# Patient Record
Sex: Female | Born: 1960 | Race: Black or African American | Hispanic: No | Marital: Married | Smoking: Never smoker
Health system: Southern US, Community
[De-identification: ages and names within clinical notes are randomized; demographics above are authoritative.]

## PROBLEM LIST (undated history)

## (undated) DIAGNOSIS — I10 Essential (primary) hypertension: Secondary | ICD-10-CM

## (undated) HISTORY — PX: MYOMECTOMY: SHX85

## (undated) HISTORY — DX: Essential (primary) hypertension: I10

## (undated) HISTORY — PX: ABDOMINAL HYSTERECTOMY: SHX81

---

## 2003-09-14 ENCOUNTER — Encounter: Admission: RE | Admit: 2003-09-14 | Discharge: 2003-09-14 | Payer: Self-pay | Admitting: Family Medicine

## 2003-09-29 ENCOUNTER — Encounter: Admission: RE | Admit: 2003-09-29 | Discharge: 2003-09-29 | Payer: Self-pay | Admitting: Family Medicine

## 2004-11-11 ENCOUNTER — Encounter: Admission: RE | Admit: 2004-11-11 | Discharge: 2004-11-11 | Payer: Self-pay | Admitting: Family Medicine

## 2004-12-06 ENCOUNTER — Encounter: Admission: RE | Admit: 2004-12-06 | Discharge: 2004-12-06 | Payer: Self-pay | Admitting: Family Medicine

## 2004-12-15 ENCOUNTER — Ambulatory Visit (HOSPITAL_COMMUNITY): Admission: RE | Admit: 2004-12-15 | Discharge: 2004-12-15 | Payer: Self-pay | Admitting: Gastroenterology

## 2005-02-13 LAB — HM COLONOSCOPY: HM Colonoscopy: NORMAL

## 2006-03-21 ENCOUNTER — Encounter: Admission: RE | Admit: 2006-03-21 | Discharge: 2006-03-21 | Payer: Self-pay | Admitting: Family Medicine

## 2007-04-25 ENCOUNTER — Ambulatory Visit (HOSPITAL_COMMUNITY): Admission: RE | Admit: 2007-04-25 | Discharge: 2007-04-25 | Payer: Self-pay | Admitting: Family Medicine

## 2008-03-27 LAB — HM PAP SMEAR: HM Pap smear: NORMAL

## 2008-06-09 ENCOUNTER — Encounter: Admission: RE | Admit: 2008-06-09 | Discharge: 2008-06-09 | Payer: Self-pay | Admitting: Family Medicine

## 2009-06-16 ENCOUNTER — Ambulatory Visit (HOSPITAL_COMMUNITY): Admission: RE | Admit: 2009-06-16 | Discharge: 2009-06-16 | Payer: Self-pay | Admitting: Family Medicine

## 2010-07-10 ENCOUNTER — Encounter: Payer: Self-pay | Admitting: Family Medicine

## 2010-07-15 ENCOUNTER — Other Ambulatory Visit: Payer: Self-pay | Admitting: Family Medicine

## 2010-07-15 DIAGNOSIS — Z139 Encounter for screening, unspecified: Secondary | ICD-10-CM

## 2010-07-20 ENCOUNTER — Ambulatory Visit (HOSPITAL_COMMUNITY)
Admission: RE | Admit: 2010-07-20 | Discharge: 2010-07-20 | Disposition: A | Discharge status: Home / Self Care | Payer: Private Health Insurance - Indemnity | Source: Ambulatory Visit | Attending: Family Medicine | Admitting: Family Medicine

## 2010-07-20 ENCOUNTER — Encounter (HOSPITAL_COMMUNITY): Payer: Self-pay

## 2010-07-20 DIAGNOSIS — Z139 Encounter for screening, unspecified: Secondary | ICD-10-CM

## 2010-07-20 DIAGNOSIS — Z1231 Encounter for screening mammogram for malignant neoplasm of breast: Secondary | ICD-10-CM

## 2010-11-04 NOTE — Op Note (Signed)
NAMECIELLA, OBI              ACCOUNT NO.:  [id number]   MEDICAL RECORD NO.:  [id number]          PATIENT TYPE:  AMB   LOCATION:  ENDO                         FACILITY:  [facility]   PHYSICIAN:  Petra Kuba, M.D.    DATE OF BIRTH:  1960-10-14   DATE OF PROCEDURE:  12/15/2004  DATE OF DISCHARGE:                                 OPERATIVE REPORT   PROCEDURE:  Colonoscopy.   ENDOSCOPIST:  Petra Kuba, M.D.   INDICATION:  Guaiac positivity.   Consent was signed after risks, benefits, methods, and options were  thoroughly discussed in the office.   MEDICINES USED:  Demerol 70, Versed 7.   DESCRIPTION OF PROCEDURE:  Rectal inspection was pertinent for external  hemorrhoids, small.  Digital exam was negative. Video pediatric adjustable  colonoscope was inserted and easily advanced around the colon to the cecum.  Other than one left-sided diverticula, no abnormality was seen on insertion.  The  cecum was identified by the appendiceal orifice and the ileocecal  valve. Scope was inserted into the end of the terminal ileum.  Quick  evaluation was obtained but we kept falling back into the cecum. No  abnormalities were seen on quick evaluation.  The scope was slowly  withdrawn. Prep was adequate. There was minimal liquid stool that required  washing and suctioning.  On slow withdrawal through the colon, other than  the sigmoid diverticula seen on insertion, no other abnormalities were seen  as we slowly withdrew back to the rectum.  Anorectal pull-through in  retroflexion confirmed some small hemorrhoids. Scope was straightened abd re-  advanced a short ways up the left side of the colon.  Air was suctioned and  the scope removed. The patient tolerated the procedure well. There was no  obvious immediate complication.   ENDOSCOPIC DIAGNOSIS:  1. Internal and external hemorrhoids.  2. One sigmoid diverticula.  3. Otherwise, within normal limits to the cecum and a quick look in the  terminal ileum without any blood being seen.   PLAN:  Follow-up p.r.n. or in six weeks to recheck guaiac symptoms, possibly  CBC, and make sure no further work-up plans are needed, otherwise repeat  screening in five years at age 61.   INDICATIONS:  The hepatic       MEM/MEDQ  D:  12/15/2004  T:  12/15/2004  Job:  [id number]   cc:   Jari Pigg, M.D.  [facility]

## 2011-07-19 ENCOUNTER — Other Ambulatory Visit: Payer: Self-pay | Admitting: Family Medicine

## 2011-07-19 DIAGNOSIS — Z1231 Encounter for screening mammogram for malignant neoplasm of breast: Secondary | ICD-10-CM

## 2011-08-17 ENCOUNTER — Ambulatory Visit (HOSPITAL_COMMUNITY)
Admission: RE | Admit: 2011-08-17 | Discharge: 2011-08-17 | Disposition: A | Discharge status: Home / Self Care | Payer: Private Health Insurance - Indemnity | Source: Ambulatory Visit | Attending: Family Medicine | Admitting: Family Medicine

## 2011-08-17 DIAGNOSIS — Z1231 Encounter for screening mammogram for malignant neoplasm of breast: Secondary | ICD-10-CM

## 2012-08-26 ENCOUNTER — Other Ambulatory Visit: Payer: Self-pay | Admitting: Family Medicine

## 2012-08-26 DIAGNOSIS — Z1231 Encounter for screening mammogram for malignant neoplasm of breast: Secondary | ICD-10-CM

## 2012-09-09 ENCOUNTER — Ambulatory Visit (HOSPITAL_COMMUNITY)
Admission: RE | Admit: 2012-09-09 | Discharge: 2012-09-09 | Disposition: A | Discharge status: Home / Self Care | Payer: Private Health Insurance - Indemnity | Source: Ambulatory Visit | Attending: Family Medicine | Admitting: Family Medicine

## 2012-09-09 DIAGNOSIS — Z1231 Encounter for screening mammogram for malignant neoplasm of breast: Secondary | ICD-10-CM | POA: Insufficient documentation

## 2012-12-02 ENCOUNTER — Telehealth: Payer: Self-pay | Admitting: Physician Assistant

## 2012-12-02 DIAGNOSIS — E785 Hyperlipidemia, unspecified: Secondary | ICD-10-CM

## 2012-12-02 DIAGNOSIS — I10 Essential (primary) hypertension: Secondary | ICD-10-CM

## 2012-12-02 DIAGNOSIS — Z79899 Other long term (current) drug therapy: Secondary | ICD-10-CM

## 2012-12-02 MED ORDER — HYDROCHLOROTHIAZIDE 25 MG PO TABS: 25.0000 mg | ORAL_TABLET | Freq: Every day | ORAL | Status: DC

## 2012-12-02 MED ORDER — BENAZEPRIL HCL 10 MG PO TABS: 10.0000 mg | ORAL_TABLET | Freq: Every day | ORAL | Status: DC

## 2012-12-02 MED ORDER — METOPROLOL TARTRATE 25 MG PO TABS: 12.5000 mg | ORAL_TABLET | Freq: Two times a day (BID) | ORAL | Status: DC

## 2012-12-02 NOTE — Telephone Encounter (Signed)
Medication refilled per protocol.Patient needs to be seen before any further refills  Pt called and routine f/u appt made and labs orders placed. Pt to come prior to visit and have labs done.

## 2012-12-02 NOTE — Telephone Encounter (Signed)
Pt will get 90 day supply when seen for her OV.

## 2012-12-09 ENCOUNTER — Other Ambulatory Visit (INDEPENDENT_AMBULATORY_CARE_PROVIDER_SITE_OTHER): Payer: Self-pay

## 2012-12-09 DIAGNOSIS — Z79899 Other long term (current) drug therapy: Secondary | ICD-10-CM

## 2012-12-09 DIAGNOSIS — I10 Essential (primary) hypertension: Secondary | ICD-10-CM

## 2012-12-09 DIAGNOSIS — E785 Hyperlipidemia, unspecified: Secondary | ICD-10-CM

## 2012-12-09 LAB — CBC WITH DIFFERENTIAL/PLATELET
Basophils Absolute: 0 10*3/uL (ref 0.0–0.1)
Basophils Relative: 0 % (ref 0–1)
Eosinophils Absolute: 0.1 10*3/uL (ref 0.0–0.7)
Eosinophils Relative: 1 % (ref 0–5)
Lymphs Abs: 2.5 10*3/uL (ref 0.7–4.0)
MCH: 28 pg (ref 26.0–34.0)
MCHC: 33.7 g/dL (ref 30.0–36.0)
MCV: 83 fL (ref 78.0–100.0)
Neutrophils Relative %: 65 % (ref 43–77)
Platelets: 250 10*3/uL (ref 150–400)
RDW: 13.6 % (ref 11.5–15.5)

## 2012-12-09 LAB — COMPLETE METABOLIC PANEL WITH GFR
AST: 18 U/L (ref 0–37)
Albumin: 4.2 g/dL (ref 3.5–5.2)
Alkaline Phosphatase: 82 U/L (ref 39–117)
BUN: 12 mg/dL (ref 6–23)
GFR, Est Non African American: 76 mL/min
Glucose, Bld: 97 mg/dL (ref 70–99)
Potassium: 3.6 mEq/L (ref 3.5–5.3)
Total Bilirubin: 0.4 mg/dL (ref 0.3–1.2)

## 2012-12-09 LAB — LIPID PANEL
Cholesterol: 198 mg/dL (ref 0–200)
HDL: 39 mg/dL — ABNORMAL LOW (ref >39)
Total CHOL/HDL Ratio: 5.1 Ratio
Triglycerides: 180 mg/dL — ABNORMAL HIGH (ref <150)
VLDL: 36 mg/dL (ref 0–40)

## 2012-12-11 ENCOUNTER — Ambulatory Visit (INDEPENDENT_AMBULATORY_CARE_PROVIDER_SITE_OTHER): Payer: Managed Care, Other (non HMO) | Admitting: Physician Assistant

## 2012-12-11 ENCOUNTER — Encounter: Payer: Self-pay | Admitting: Physician Assistant

## 2012-12-11 VITALS — BP 134/90 | HR 68 | Temp 98.5°F | Resp 18 | Ht 62.75 in | Wt 218.0 lb

## 2012-12-11 DIAGNOSIS — I10 Essential (primary) hypertension: Secondary | ICD-10-CM

## 2012-12-11 NOTE — Progress Notes (Signed)
   Patient ID: Rachel Estes MRN: [id number], DOB: 1961-02-11, 52 y.o. Date of Encounter: 12/11/2012, 2:22 PM    Chief Complaint:  Chief Complaint  Patient presents with  . 6 mth check up     HPI: 52 y.o. year old AA female here for routine f/u of her HTN. She is taking all 3 meds as directed. No adv effects. No LE edema.  She has no complaints today.    Home Meds: See attached medication section for any medications that were entered at today's visit. The computer does not put those onto this list.The following list is a list of meds entered prior to today's visit.   Current Outpatient Prescriptions on File Prior to Visit  Medication Sig Dispense Refill  . benazepril (LOTENSIN) 10 MG tablet Take 1 tablet (10 mg total) by mouth daily.  30 tablet  0  . hydrochlorothiazide (HYDRODIURIL) 25 MG tablet Take 1 tablet (25 mg total) by mouth daily.  30 tablet  0  . metoprolol tartrate (LOPRESSOR) 25 MG tablet Take 0.5 tablets (12.5 mg total) by mouth 2 (two) times daily.  30 tablet  0   No current facility-administered medications on file prior to visit.    Allergies: No Known Allergies    Review of Systems: See HPI for pertinent ROS. All other ROS negative.    Physical Exam: Blood pressure 134/90, pulse 68, temperature 98.5 F (36.9 C), temperature source Oral, resp. rate 18, height 5' 2.75" (1.594 m), weight 218 lb (98.884 kg)., Body mass index is 38.92 kg/(m^2). General: Obese AAF. Appears in no acute distress. Neck: Supple. No thyromegaly. No lymphadenopathy. No carotid bruits. Lungs: Clear bilaterally to auscultation without wheezes, rales, or rhonchi. Breathing is unlabored. Heart: Regular rhythm. No murmurs, rubs, or gallops. Msk:  Strength and tone normal for age. Extremities/Skin: Warm and dry.  No edema.  Neuro: Alert and oriented X 3. Moves all extremities spontaneously. Gait is normal. CNII-XII grossly in tact. Psych:  Responds to questions appropriately with a normal  affect.     ASSESSMENT AND PLAN:  52 y.o. year old female with  1. Hypertension Controlled. Cont current meds. She had BMET this past week, which was normal  2-Obesity: Discussed again today need for improved diet and need for exercise.   3- Lipids: Tri 180, HDL-39, LDL 123.     Only CRF are HTN and Obesity. Does not require medication but still needs improved diet, exercise.   4-mAMOGRAM: PT SAYS SHE DID HAVE F/U THIS year.  5. Colonoscopy: Had at age 52. Repeat 10 years-at age 52.  6.Tetanus: UTD-had 2007.  7. H/O Hysterectomy  ROV 6 months, sooner if needed.  Murray Hodgkins [place], [place], [facility] 12/11/2012 2:22 PM

## 2013-01-14 ENCOUNTER — Other Ambulatory Visit: Payer: Self-pay | Admitting: Physician Assistant

## 2013-01-14 ENCOUNTER — Telehealth: Payer: Self-pay | Admitting: Physician Assistant

## 2013-01-14 MED ORDER — HYDROCHLOROTHIAZIDE 25 MG PO TABS: 25.0000 mg | ORAL_TABLET | Freq: Every day | ORAL | Status: DC

## 2013-01-14 NOTE — Telephone Encounter (Signed)
rx changed to 90 + 1

## 2013-01-14 NOTE — Telephone Encounter (Signed)
Medication refilled per protocol. 

## 2013-01-15 NOTE — Telephone Encounter (Signed)
Medication refilled per protocol. 

## 2013-03-02 ENCOUNTER — Other Ambulatory Visit: Payer: Self-pay | Admitting: Physician Assistant

## 2013-03-03 ENCOUNTER — Ambulatory Visit (INDEPENDENT_AMBULATORY_CARE_PROVIDER_SITE_OTHER): Payer: Managed Care, Other (non HMO) | Admitting: Family Medicine

## 2013-03-03 ENCOUNTER — Encounter: Payer: Self-pay | Admitting: Family Medicine

## 2013-03-03 VITALS — BP 160/92 | HR 78 | Temp 98.3°F | Resp 18

## 2013-03-03 DIAGNOSIS — M79671 Pain in right foot: Secondary | ICD-10-CM | POA: Insufficient documentation

## 2013-03-03 DIAGNOSIS — M79609 Pain in unspecified limb: Secondary | ICD-10-CM

## 2013-03-03 DIAGNOSIS — I10 Essential (primary) hypertension: Secondary | ICD-10-CM

## 2013-03-03 MED ORDER — NAPROXEN 500 MG PO TABS: 500.0000 mg | ORAL_TABLET | Freq: Two times a day (BID) | ORAL | Status: DC

## 2013-03-03 NOTE — Patient Instructions (Signed)
[address] , [place] Take Naprosyn twice a day with food Crutches get from [facility]

## 2013-03-03 NOTE — Assessment & Plan Note (Signed)
BP typically well controlled, continue current meds, may be elevated secondary to pain

## 2013-03-03 NOTE — Progress Notes (Signed)
  Subjective:    Patient ID: Rachel Estes, female    DOB: [birth year], 52 y.o.   MRN: [id number]  HPI  Right foot and ankle pain  4 days. No specific injury. Was walking a lot in [place] over the weekend. Noticed pain on lateral aspect of ankle and swelling. Swelling did improve some with ACE wrap but felt toes were swelling so she removed it. Unable to bear weight without significant pain. No OTC meds taken. Denies fever, skin rash. Denies knee pain.   Review of Systems  GEN- denies fatigue, fever, weight loss,weakness, recent illness MSK- + joint pain, muscle aches, injury       Objective:   Physical Exam  GEN-NAD,alert and oriented x 3, sitting in wheelchair MSK- Inspection right ankle +swelling, mild erythema compared to left foot, no warmth. Decreased ROM Right ankle, pain with eversion and inversion. TTP along lateral malleous and lateral aspect of foot, taler dome NT.+squeeze test- lateral side, FROM toes. Right knee- normal inspection, FROM Skin- no rash Pulse- DP, PT 2+      Assessment & Plan:

## 2013-03-03 NOTE — Assessment & Plan Note (Signed)
Differentials- peroneal tendiitis, vs ankle [place], vs possible stress fracture. Will obtain xray of foot RICE therapy, Naprosyn BID Crutches given, ACE wrap

## 2013-03-04 ENCOUNTER — Ambulatory Visit
Admission: RE | Admit: 2013-03-04 | Discharge: 2013-03-04 | Disposition: A | Discharge status: Home / Self Care | Payer: Private Health Insurance - Indemnity | Source: Ambulatory Visit | Attending: Family Medicine | Admitting: Family Medicine

## 2013-03-04 DIAGNOSIS — M79671 Pain in right foot: Secondary | ICD-10-CM

## 2013-03-12 ENCOUNTER — Ambulatory Visit (INDEPENDENT_AMBULATORY_CARE_PROVIDER_SITE_OTHER): Payer: Managed Care, Other (non HMO) | Admitting: Family Medicine

## 2013-03-12 ENCOUNTER — Encounter: Payer: Self-pay | Admitting: Family Medicine

## 2013-03-12 VITALS — BP 138/70 | HR 78 | Temp 98.0°F | Resp 20 | Ht 64.0 in | Wt 221.0 lb

## 2013-03-12 DIAGNOSIS — S93401A Sprain of unspecified ligament of right ankle, initial encounter: Secondary | ICD-10-CM | POA: Insufficient documentation

## 2013-03-12 DIAGNOSIS — S93401D Sprain of unspecified ligament of right ankle, subsequent encounter: Secondary | ICD-10-CM

## 2013-03-12 DIAGNOSIS — M79609 Pain in unspecified limb: Secondary | ICD-10-CM

## 2013-03-12 NOTE — Assessment & Plan Note (Signed)
Resolved Okay to dc NSAIDS Return to regular activity

## 2013-03-12 NOTE — Patient Instructions (Addendum)
Continue all other medications  Okay to get back to regular activiites F/U as needed

## 2013-03-12 NOTE — Progress Notes (Signed)
  Subjective:    Patient ID: Rachel Estes, female    DOB: [birth year], 52 y.o.   MRN: [id number]  HPI  Pt here for recheck on right ankle sprain. Was seen 1 week ago for pain in right foot and ankle, xray neg, completed course of NSAIDS and RICE therapy, pain now resolved, able to walk without pain or difficulty. No concerns today  Declines flu shot  Review of Systems  GEN- denies fatigue, fever, weight loss,weakness, recent illness MSK- denies joint pain, muscle aches, injury        Objective:   Physical Exam GEN-NAD, alert and oriented x 3 MSK- RIght foot, no swelling, normal inspection, neg squeeze test, FROM, strength equal bilat in foot and ankle, able to bear weight on right foot without pain Pulse- 2+ DP       Assessment & Plan:

## 2013-06-09 ENCOUNTER — Encounter: Payer: Self-pay | Admitting: Physician Assistant

## 2013-06-09 ENCOUNTER — Ambulatory Visit (INDEPENDENT_AMBULATORY_CARE_PROVIDER_SITE_OTHER): Payer: Managed Care, Other (non HMO) | Admitting: Physician Assistant

## 2013-06-09 VITALS — BP 132/84 | HR 88 | Temp 99.0°F | Resp 18 | Wt 214.0 lb

## 2013-06-09 DIAGNOSIS — I10 Essential (primary) hypertension: Secondary | ICD-10-CM

## 2013-06-09 LAB — BASIC METABOLIC PANEL WITH GFR
CO2: 30 mEq/L (ref 19–32)
Calcium: 9.4 mg/dL (ref 8.4–10.5)
Creat: 0.85 mg/dL (ref 0.50–1.10)
GFR, Est African American: >89 mL/min
Sodium: 141 mEq/L (ref 135–145)

## 2013-06-09 MED ORDER — METOPROLOL TARTRATE 25 MG PO TABS: ORAL_TABLET | ORAL | Status: DC

## 2013-06-09 MED ORDER — BENAZEPRIL HCL 10 MG PO TABS: ORAL_TABLET | ORAL | Status: DC

## 2013-06-09 MED ORDER — HYDROCHLOROTHIAZIDE 25 MG PO TABS: 25.0000 mg | ORAL_TABLET | Freq: Every day | ORAL | Status: DC

## 2013-06-09 NOTE — Progress Notes (Signed)
Patient ID: Rachel Estes MRN: [id number], DOB: 10-20-1960, 52 y.o. Date of Encounter: 06/09/2013, 10:15 AM    Chief Complaint:  Chief Complaint  Patient presents with  . check up, med refills    is fasting     HPI: 52 y.o. year old AA femalemale here for followup of her hypertension.  She is taking all 3 blood pressure medications as directed. No adverse effects. No lightheadedness. No lower extremity edema.  She has no complaints today.  She has a daughter who is in college in [place] [employer]. 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 Her son just graduated from [employer] in December. Patient does not work. 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     Home Meds: See attached medication section for any medications that were entered at today's visit. The computer does not put those onto this list.The following list is a list of meds entered prior to today's visit.   No current outpatient prescriptions on file prior to visit.   No current facility-administered medications on file prior to visit.    Allergies: No Known Allergies    Review of Systems: See HPI for pertinent ROS. All other ROS negative.    Physical Exam: Blood pressure 132/84, pulse 88, temperature 99 F (37.2 C), temperature source Oral, resp. rate 18, weight 214 lb (97.07 kg)., Body mass index is 36.72 kg/(m^2). General: An obese African American female. Appears in no acute distress. Neck: Supple. No thyromegaly. No lymphadenopathy. No carotid bruit. Lungs: Clear bilaterally to auscultation without wheezes, rales, or rhonchi. Breathing is unlabored. Heart: Regular rhythm. No murmurs, rubs, or gallops. Msk:  Strength and tone normal for age. Extremities/Skin: Warm and dry. No edema.  Neuro: Alert and oriented X 3. Moves all extremities spontaneously. Gait is  normal. CNII-XII grossly in tact. Psych:  Responds to questions appropriately with a normal affect.     ASSESSMENT AND PLAN:  52 y.o. year old female with  1. Hypertension Blood pressure control. Continue current medications. Check labs monitor. - BASIC METABOLIC PANEL WITH GFR - metoprolol tartrate (LOPRESSOR) 25 MG tablet; TAKE 1/2 TABLET BY MOUTH TWICE A DAY  Dispense: 90 tablet; Refill: 1 - hydrochlorothiazide (HYDRODIURIL) 25 MG tablet; Take 1 tablet (25 mg total) by mouth daily.  Dispense: 90 tablet; Refill: 1 - benazepril (LOTENSIN) 10 MG tablet; TAKE 1 TABLET BY MOUTH EVERY DAY  Dispense: 90 tablet; Refill: 1  2. obesity: We had discussed need for improved diet and need for exercise in the past. Today her weight is down 4 pounds compared to 12/11/12  3. Lipids: Her only cardiac risk factors are hypertension and obesity.  Therefore she has not required medication for cholesterol.  Last lipid panel was performed 12/09/12. This showed cholesterol 198, triglyceride 180, HDL 39, LDL 123. Prior to that she had improved her some diet somewhat. Today she says that she has to quit his diet changes and feels that her diet is as good as it was at that time. As well her weight is down 4 pounds compared to June. Therefore we can wait to recheck lipid panel. Feel that this should be stable compared to the numbers from June.  4. hemogram: Patient has had followup this year.  5. colonoscopy: At age 52. Repeat 10 years. years. Will be due at age 20.  6. immune medications: Tetanus: She had this 2007. Up to date. Influenza vaccine: I discussed today. She defers.  7. history of hysterectomy.  Your office visit six-month, sooner if needed.  Signed, [address] [place], [place], [facility] 06/09/2013 10:15 AM

## 2013-06-13 ENCOUNTER — Encounter: Payer: Self-pay | Admitting: Family Medicine

## 2013-07-09 ENCOUNTER — Other Ambulatory Visit: Payer: Self-pay | Admitting: Physician Assistant

## 2013-07-09 NOTE — Telephone Encounter (Signed)
Med were just refilled recently.  This was verified with pharmacy

## 2013-09-18 ENCOUNTER — Other Ambulatory Visit: Payer: Self-pay | Admitting: Family Medicine

## 2013-09-18 DIAGNOSIS — Z1231 Encounter for screening mammogram for malignant neoplasm of breast: Secondary | ICD-10-CM

## 2013-09-26 ENCOUNTER — Ambulatory Visit (HOSPITAL_COMMUNITY)
Admission: RE | Admit: 2013-09-26 | Discharge: 2013-09-26 | Disposition: A | Discharge status: Home / Self Care | Payer: Private Health Insurance - Indemnity | Source: Ambulatory Visit | Attending: Family Medicine | Admitting: Family Medicine

## 2013-09-26 DIAGNOSIS — Z1231 Encounter for screening mammogram for malignant neoplasm of breast: Secondary | ICD-10-CM | POA: Insufficient documentation

## 2013-10-17 ENCOUNTER — Encounter: Payer: Self-pay | Admitting: Family Medicine

## 2013-10-17 ENCOUNTER — Ambulatory Visit (INDEPENDENT_AMBULATORY_CARE_PROVIDER_SITE_OTHER): Payer: Managed Care, Other (non HMO) | Admitting: Family Medicine

## 2013-10-17 VITALS — BP 128/74 | HR 72 | Temp 97.7°F | Resp 16 | Ht 64.0 in | Wt 214.0 lb

## 2013-10-17 DIAGNOSIS — H6120 Impacted cerumen, unspecified ear: Secondary | ICD-10-CM

## 2013-10-17 DIAGNOSIS — H918X9 Other specified hearing loss, unspecified ear: Secondary | ICD-10-CM

## 2013-10-17 NOTE — Progress Notes (Signed)
   Subjective:    Patient ID: Rachel CooperMelinda L Grist, female    DOB: 05/18/1961, 53 y.o.   MRN: [id number]  HPI  Patient is a very pleasant 53 year old [place] female who presents with hearing loss in her left ear. Symptoms began Monday. She has a history of cerumen impactions in the left ear previously that were treated with lavage. She has tried over-the-counter eardrops with no success. She tried Q-tips with no success. She is here today for further evaluation. She denies any otalgia, tinnitus, headaches, or dizziness. Past Medical History  Diagnosis Date  . Hypertension    Current Outpatient Prescriptions on File Prior to Visit  Medication Sig Dispense Refill  . benazepril (LOTENSIN) 10 MG tablet TAKE 1 TABLET BY MOUTH EVERY DAY  90 tablet  1  . hydrochlorothiazide (HYDRODIURIL) 25 MG tablet Take 1 tablet (25 mg total) by mouth daily.  90 tablet  1  . metoprolol tartrate (LOPRESSOR) 25 MG tablet TAKE 1/2 TABLET BY MOUTH TWICE A DAY  90 tablet  1   No current facility-administered medications on file prior to visit.   No Known Allergies History   Social History  . Marital Status: Married    Spouse Name: N/A    Number of Children: N/A  . Years of Education: N/A   Occupational History  . Not on file.   Social History Main Topics  . Smoking status: Never Smoker   . Smokeless tobacco: Never Used  . Alcohol Use: No  . Drug Use: No  . Sexual Activity: Not on file   Other Topics Concern  . Not on file   Social History Narrative  . No narrative on file     Review of Systems  All other systems reviewed and are negative.      Objective:   Physical Exam  Vitals reviewed. Constitutional: She is oriented to person, place, and time. She appears well-developed and well-nourished.  HENT:  Right Ear: Tympanic membrane and ear canal normal.  Left Ear: A foreign body is present. Decreased hearing is noted.  Cardiovascular: Normal rate, regular rhythm and normal heart  sounds.   Pulmonary/Chest: Effort normal and breath sounds normal.  Neurological: She is alert and oriented to person, place, and time. No cranial nerve deficit.   left external auditory canal is completely occluded with cerumen impaction        Assessment & Plan:  1. Hearing loss secondary to cerumen impaction Cerumen impaction was removed using a combination of irrigation/lavage and manual removal with a curet. Patient tolerated procedure well without complications. Symptoms improved upon removal of the impaction.

## 2013-12-10 ENCOUNTER — Ambulatory Visit (INDEPENDENT_AMBULATORY_CARE_PROVIDER_SITE_OTHER): Payer: Managed Care, Other (non HMO) | Admitting: Physician Assistant

## 2013-12-10 ENCOUNTER — Encounter: Payer: Self-pay | Admitting: Physician Assistant

## 2013-12-10 VITALS — BP 136/94 | HR 60 | Temp 98.3°F | Resp 18 | Wt 216.0 lb

## 2013-12-10 DIAGNOSIS — I10 Essential (primary) hypertension: Secondary | ICD-10-CM

## 2013-12-10 LAB — BASIC METABOLIC PANEL WITH GFR
BUN: 15 mg/dL (ref 6–23)
CALCIUM: 9.8 mg/dL (ref 8.4–10.5)
CO2: 31 mEq/L (ref 19–32)
Chloride: 98 mEq/L (ref 96–112)
Creat: 0.86 mg/dL (ref 0.50–1.10)
GFR, Est African American: 89 mL/min
GFR, Est Non African American: 77 mL/min
Glucose, Bld: 97 mg/dL (ref 70–99)
Potassium: 3.5 mEq/L (ref 3.5–5.3)
SODIUM: 139 meq/L (ref 135–145)

## 2013-12-10 NOTE — Progress Notes (Signed)
Patient ID: Rachel Estes MRN: [id number], DOB: 10/06/1960, 53 y.o. Date of Encounter: 12/10/2013, 10:21 AM    Chief Complaint:  Chief Complaint  Patient presents with  . blood pressure check     HPI: 53 y.o. year old AA female here for followup of her hypertension.  She is taking all 3 blood pressure medications as directed. No adverse effects. No lightheadedness. No lower extremity edema. She says that she does check her blood pressure at home and gets good readings--- usually around 126/84.  She has no complaints today.  She has a daughter who is in college in [employer] [employer]. 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 Her son just graduated from [employer] in December. Says that he actually is at home with her right now. Says that he was a communications major. Is that she is trying to get him to realize that he has got to "get it togehter' Patient does not work. 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     Home Meds:    Outpatient Prescriptions Prior to Visit  Medication Sig Dispense Refill  . benazepril (LOTENSIN) 10 MG tablet TAKE 1 TABLET BY MOUTH EVERY DAY  90 tablet  1  . hydrochlorothiazide (HYDRODIURIL) 25 MG tablet Take 1 tablet (25 mg total) by mouth daily.  90 tablet  1  . metoprolol tartrate (LOPRESSOR) 25 MG tablet TAKE 1/2 TABLET BY MOUTH TWICE A DAY  90 tablet  1   No facility-administered medications prior to visit.     Allergies: No Known Allergies    Review of Systems: See HPI for pertinent ROS. All other ROS negative.    Physical Exam: Blood pressure 136/94, pulse 60, temperature 98.3 F (36.8 C), temperature source Oral, resp. rate 18, weight 216 lb (97.977 kg)., Body mass index is 37.06 kg/(m^2). General: An obese African American female. Appears in no acute distress. Neck: Supple. No thyromegaly. No  lymphadenopathy. No carotid bruit. Lungs: Clear bilaterally to auscultation without wheezes, rales, or rhonchi. Breathing is unlabored. Heart: Regular rhythm. No murmurs, rubs, or gallops. Msk:  Strength and tone normal for age. Extremities/Skin: Warm and dry. No edema.  Neuro: Alert and oriented X 3. Moves all extremities spontaneously. Gait is normal. CNII-XII grossly in tact. Psych:  Responds to questions appropriately with a normal affect.     ASSESSMENT AND PLAN:  53 y.o. year old female with  1. Hypertension Blood pressure control. Continue current medications. Check labs monitor. - BASIC METABOLIC PANEL WITH GFR - metoprolol tartrate (LOPRESSOR) 25 MG tablet; TAKE 1/2 TABLET BY MOUTH TWICE A DAY  Dispense: 90 tablet; Refill: 1 - hydrochlorothiazide (HYDRODIURIL) 25 MG tablet; Take 1 tablet (25 mg total) by mouth daily.  Dispense: 90 tablet; Refill: 1 - benazepril (LOTENSIN) 10 MG tablet; TAKE 1 TABLET BY MOUTH EVERY DAY  Dispense: 90 tablet; Refill: 1  2. obesity: We had discussed need for improved diet and need for exercise in the past. At OV 05/2013 her weight was down 4 pounds compared to 12/11/12  3. Lipids: Her only cardiac risk factors are hypertension and obesity.  Therefore she has not required medication for cholesterol.  Last lipid panel was performed 12/09/12. This showed cholesterol 198, triglyceride 180, HDL 39, LDL 123. Prior to that she had improved her some diet somewhat. Today she says  that she has stuck with diet changes and feels that her diet is as good as it was at that time.  Therefore we can wait to recheck lipid panel. Feel that this should be stable compared to the numbers from June 2014.  4. Mammogram: Patient has had followup this year.  5. colonoscopy: At age 53. Repeat 10 years. Will be due at age 53.  6. Immunizations: Tetanus: She had this 2007. Up to date. Influenza vaccine: I discussed at OV 05/2013. She deferred.  7. History of  Hysterectomy.  Routine F/U Office Visit  6 Months, sooner if needed.  [address], Mary Beth [place], [place], [facility] 12/10/2013 10:21 AM

## 2013-12-11 ENCOUNTER — Other Ambulatory Visit: Payer: Self-pay | Admitting: Physician Assistant

## 2013-12-11 NOTE — Telephone Encounter (Signed)
Refill appropriate and filled per protocol. 

## 2014-01-08 ENCOUNTER — Other Ambulatory Visit: Payer: Self-pay | Admitting: Physician Assistant

## 2014-01-08 NOTE — Telephone Encounter (Signed)
Medication refilled per protocol. 

## 2014-06-07 ENCOUNTER — Other Ambulatory Visit: Payer: Self-pay | Admitting: Physician Assistant

## 2014-06-10 ENCOUNTER — Encounter: Payer: Self-pay | Admitting: Physician Assistant

## 2014-06-10 ENCOUNTER — Ambulatory Visit (INDEPENDENT_AMBULATORY_CARE_PROVIDER_SITE_OTHER): Payer: Managed Care, Other (non HMO) | Admitting: Physician Assistant

## 2014-06-10 VITALS — BP 110/74 | HR 68 | Temp 98.1°F | Resp 18 | Wt 216.0 lb

## 2014-06-10 DIAGNOSIS — I10 Essential (primary) hypertension: Secondary | ICD-10-CM

## 2014-06-10 LAB — BASIC METABOLIC PANEL WITH GFR
BUN: 17 mg/dL (ref 6–23)
CHLORIDE: 99 meq/L (ref 96–112)
CO2: 31 mEq/L (ref 19–32)
Calcium: 9.7 mg/dL (ref 8.4–10.5)
Creat: 0.84 mg/dL (ref 0.50–1.10)
GFR, EST NON AFRICAN AMERICAN: 78 mL/min
Glucose, Bld: 92 mg/dL (ref 70–99)
Potassium: 3.6 mEq/L (ref 3.5–5.3)
SODIUM: 144 meq/L (ref 135–145)

## 2014-06-10 MED ORDER — METOPROLOL TARTRATE 25 MG PO TABS: 12.5000 mg | ORAL_TABLET | Freq: Two times a day (BID) | ORAL | Status: DC

## 2014-06-10 MED ORDER — HYDROCHLOROTHIAZIDE 25 MG PO TABS: ORAL_TABLET | ORAL | Status: DC

## 2014-06-10 MED ORDER — BENAZEPRIL HCL 10 MG PO TABS: 10.0000 mg | ORAL_TABLET | Freq: Every day | ORAL | Status: DC

## 2014-06-10 NOTE — Progress Notes (Signed)
Patient ID: Rachel CooperMelinda L Estes MRN: [id number], DOB: 10/05/1960, 53 y.o. Date of Encounter: 06/10/2014, 1:02 PM    Chief Complaint:  Chief Complaint  Patient presents with  . 6 mth check up    is fasting  . Medication Refill     HPI: 53 y.o. year old AA female here for followup of her hypertension.  She is taking all 3 blood pressure medications as directed. No adverse effects. No lightheadedness. No lower extremity edema. She says that she does check her blood pressure at home and gets good readings--- usually around 126/84.  She has no complaints today.  She has a daughter who is in college in [employer] [employer]. 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 Her son just graduated from [employer] in December. Says that he actually is at home with her right now. Says that he was a communications major. Is that she is trying to get him to realize that he has got to "get it togehter' Patient does not work. 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     Home Meds:    Outpatient Prescriptions Prior to Visit  Medication Sig Dispense Refill  . benazepril (LOTENSIN) 10 MG tablet TAKE 1 TABLET BY MOUTH EVERY DAY 90 tablet 1  . hydrochlorothiazide (HYDRODIURIL) 25 MG tablet TAKE 1 TABLET (25 MG TOTAL) BY MOUTH DAILY. 90 tablet 1  . metoprolol tartrate (LOPRESSOR) 25 MG tablet TAKE 1/2 TABLET BY MOUTH TWICE A DAY 90 tablet 1   No facility-administered medications prior to visit.     Allergies: No Known Allergies    Review of Systems: See HPI for pertinent ROS. All other ROS negative.    Physical Exam: Blood pressure 110/74, pulse 68, temperature 98.1 F (36.7 C), temperature source Oral, resp. rate 18, weight 216 lb (97.977 kg)., Body mass index is 37.06 kg/(m^2). General: An obese African American female. Appears in no acute distress. Neck:  Supple. No thyromegaly. No lymphadenopathy. No carotid bruit. Lungs: Clear bilaterally to auscultation without wheezes, rales, or rhonchi. Breathing is unlabored. Heart: Regular rhythm. No murmurs, rubs, or gallops. Msk:  Strength and tone normal for age. Extremities/Skin: Warm and dry. No edema.  Neuro: Alert and oriented X 3. Moves all extremities spontaneously. Gait is normal. CNII-XII grossly in tact. Psych:  Responds to questions appropriately with a normal affect.     ASSESSMENT AND PLAN:  53 y.o. year old female with  1. Hypertension Blood pressure control. Continue current medications. Check labs monitor. - BASIC METABOLIC PANEL WITH GFR - metoprolol tartrate (LOPRESSOR) 25 MG tablet; TAKE 1/2 TABLET BY MOUTH TWICE A DAY  Dispense: 90 tablet; Refill: 1 - hydrochlorothiazide (HYDRODIURIL) 25 MG tablet; Take 1 tablet (25 mg total) by mouth daily.  Dispense: 90 tablet; Refill: 1 - benazepril (LOTENSIN) 10 MG tablet; TAKE 1 TABLET BY MOUTH EVERY DAY  Dispense: 90 tablet; Refill: 1  2. obesity: We had discussed need for improved diet and need for exercise in the past. At OV 05/2013 her weight was down 4 pounds compared to 12/11/12  3. Lipids: Her only cardiac risk factors are hypertension and obesity.  Therefore she has not required medication for cholesterol.  Last lipid panel was performed 12/09/12. This showed cholesterol 198, triglyceride 180, HDL 39, LDL 123. Prior to that she had improved her some diet  somewhat. Today she says that she has stuck with diet changes and feels that her diet is as good as it was at that time.  Therefore we can wait to recheck lipid panel. Feel that this should be stable compared to the numbers from June 2014.  4. Mammogram: Patient has had followup this year.  5. colonoscopy: At age 53. Repeat 10 years. Will be due at age 53. Pt aware this is due in 2016--she will f/u with this.   6. Immunizations: Tetanus: She had this 2007. Up to date. Influenza  vaccine: I discussed at OV 05/2013.Adnat OV 05/2014.  She deferred both times--again today.   7. History of Hysterectomy.  Routine F/U Office Visit  6 Months, sooner if needed.  [address], Mary Beth [place], [place], [facility] 06/10/2014 1:02 PM

## 2014-06-15 ENCOUNTER — Encounter: Payer: Self-pay | Admitting: Family Medicine

## 2014-09-21 ENCOUNTER — Other Ambulatory Visit: Payer: Self-pay | Admitting: Family Medicine

## 2014-09-21 DIAGNOSIS — Z1231 Encounter for screening mammogram for malignant neoplasm of breast: Secondary | ICD-10-CM

## 2014-09-28 ENCOUNTER — Ambulatory Visit (HOSPITAL_COMMUNITY)
Admission: RE | Admit: 2014-09-28 | Discharge: 2014-09-28 | Disposition: A | Discharge status: Home / Self Care | Payer: Managed Care, Other (non HMO) | Source: Ambulatory Visit | Attending: Family Medicine | Admitting: Family Medicine

## 2014-09-28 DIAGNOSIS — Z1231 Encounter for screening mammogram for malignant neoplasm of breast: Secondary | ICD-10-CM

## 2014-11-23 ENCOUNTER — Encounter: Payer: Self-pay | Admitting: Physician Assistant

## 2014-12-14 ENCOUNTER — Ambulatory Visit (INDEPENDENT_AMBULATORY_CARE_PROVIDER_SITE_OTHER): Payer: Managed Care, Other (non HMO) | Admitting: Physician Assistant

## 2014-12-14 ENCOUNTER — Other Ambulatory Visit: Payer: Self-pay | Admitting: Physician Assistant

## 2014-12-14 ENCOUNTER — Encounter: Payer: Self-pay | Admitting: Physician Assistant

## 2014-12-14 VITALS — BP 118/78 | HR 60 | Temp 98.4°F | Resp 14 | Ht 64.0 in | Wt 214.0 lb

## 2014-12-14 DIAGNOSIS — E669 Obesity, unspecified: Secondary | ICD-10-CM | POA: Diagnosis not present

## 2014-12-14 DIAGNOSIS — I10 Essential (primary) hypertension: Secondary | ICD-10-CM | POA: Diagnosis not present

## 2014-12-14 DIAGNOSIS — S93401D Sprain of unspecified ligament of right ankle, subsequent encounter: Secondary | ICD-10-CM

## 2014-12-14 LAB — BASIC METABOLIC PANEL WITH GFR
BUN: 15 mg/dL (ref 6–23)
CO2: 32 meq/L (ref 19–32)
CREATININE: 0.91 mg/dL (ref 0.50–1.10)
Calcium: 10 mg/dL (ref 8.4–10.5)
Chloride: 100 mEq/L (ref 96–112)
GFR, Est African American: 82 mL/min
GFR, Est Non African American: 71 mL/min
GLUCOSE: 107 mg/dL — AB (ref 70–99)
Potassium: 3.7 mEq/L (ref 3.5–5.3)
Sodium: 142 mEq/L (ref 135–145)

## 2014-12-14 NOTE — Progress Notes (Signed)
Patient ID: Rachel CooperMelinda L Estes MRN: [id number], DOB: 05/04/1961, 54 y.o. Date of Encounter: 12/14/2014, 9:18 AM    Chief Complaint:  Chief Complaint  Patient presents with  . 6 Month med check    Pt fasting     HPI: 54 y.o. year old AA female here for followup of her hypertension.  She is taking all 3 blood pressure medications as directed. No adverse effects. No lightheadedness. No lower extremity edema. She says that she does check her blood pressure at home and gets good readings--- usually around 126/84.  She has no complaints today.  She has a daughter who is in college in [place]. 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  At OV 11/2014--says she is doing research this summer--sting rays, shrimp,etc Her son just graduated from [employer] December 2015. Says that he actually is at home with her right now. Says that he was a communications major. Says that she is trying to get him to realize that he has got to "get it together' At OV 11/2014 she says that she has continued to keep her diet about the same as it has been for the past couple years. Says that with her son at home he is always eating chicken Malaw[place]key or fish. Frequently makes smoothies with spinach kale and fruit. Patient does not work. 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   At OV 11/2014 he says that she saw Dr. Jeanice Limurham regarding some pain in her foot. Says that recently she had another episode with the same type of symptoms. Had some swelling in the dorsum lateral aspect of the right foot. Had pain in that area on the plantar air surface when she would weight bear. She treated it with rest ice elevation and it resolved.  Home Meds:    Outpatient Prescriptions Prior to Visit  Medication Sig Dispense Refill  . benazepril (LOTENSIN) 10 MG tablet Take 1 tablet (10 mg total) by  mouth daily. 90 tablet 1  . hydrochlorothiazide (HYDRODIURIL) 25 MG tablet TAKE 1 TABLET (25 MG TOTAL) BY MOUTH DAILY. 90 tablet 1  . metoprolol tartrate (LOPRESSOR) 25 MG tablet Take 0.5 tablets (12.5 mg total) by mouth 2 (two) times daily. 90 tablet 1   No facility-administered medications prior to visit.     Allergies: No Known Allergies    Review of Systems: See HPI for pertinent ROS. All other ROS negative.    Physical Exam: Blood pressure 118/78, pulse 60, temperature 98.4 F (36.9 C), temperature source Oral, resp. rate 14, height 5\' 4"  (1.626 m), weight 214 lb (97.07 kg)., Body mass index is 36.72 kg/(m^2). General: An obese African American female. Appears in no acute distress. Neck: Supple. No thyromegaly. No lymphadenopathy. No carotid bruit. Lungs: Clear bilaterally to auscultation without wheezes, rales, or rhonchi. Breathing is unlabored. Heart: Regular rhythm. No murmurs, rubs, or gallops. Msk:  Strength and tone normal for age. Extremities/Skin: Warm and dry. No edema.  Neuro: Alert and oriented X 3. Moves all extremities spontaneously. Gait is normal. CNII-XII grossly in tact. Psych:  Responds to questions appropriately with a normal affect.     ASSESSMENT AND PLAN:  54 y.o. year old female with  1. Hypertension Blood pressure control. Continue current medications. Check labs monitor. - BASIC METABOLIC PANEL WITH GFR - metoprolol tartrate (LOPRESSOR) 25 MG  tablet; TAKE 1/2 TABLET BY MOUTH TWICE A DAY  Dispense: 90 tablet; Refill: 1 - hydrochlorothiazide (HYDRODIURIL) 25 MG tablet; Take 1 tablet (25 mg total) by mouth daily.  Dispense: 90 tablet; Refill: 1 - benazepril (LOTENSIN) 10 MG tablet; TAKE 1 TABLET BY MOUTH EVERY DAY  Dispense: 90 tablet; Refill: 1  2. obesity: We had discussed need for improved diet and need for exercise in the past. At OV 05/2013 her weight was down 4 pounds compared to 12/11/12 Since then, weigth has remained stable.  05/2013-----214  lb 11/2013-------216 lb 05/2014------216 lb  3. Lipids: Her only cardiac risk factors are hypertension and obesity.  Therefore she has not required medication for cholesterol.  Last lipid panel was performed 12/09/12. This showed cholesterol 198, triglyceride 180, HDL 39, LDL 123. Prior to that she had improved her some diet somewhat. Today she says that she has stuck with diet changes and feels that her diet is as good as it was at that time.  Therefore we can wait to recheck lipid panel. Feel that this should be stable compared to the numbers from June 2014. See HPI regarding diet 11/2014  4. Right ankle sprain, subsequent encounter At OV 11/2014 gave  Her 2 handouts--- one shows the anatomy of all of the ligaments and tendons in the foot and ankle. One has exercises she can do to stretch and strengthen the ankle and foot. Told her to do these on a routine basis daily.--To help prevent recurrence of sprain.   4. Mammogram: Patient has had followup this year.  5. colonoscopy: At age 6. Repeat 10 years. Will be due at age 66. Pt aware this is due in 2016--she will f/u with this. At OV 11/2014--says that she has received a letter from GI reminding her to schedule this and she plans to follow-up with this.  6. Immunizations: Tetanus: She had this 2007. Up to date. Influenza vaccine: I discussed at OV 05/2013.Adnat OV 05/2014.  She deferred both times--again today.   7. History of Hysterectomy.  Routine F/U Office Visit  6 Months, sooner if needed.  Murray Hodgkins [place], [place], [facility] 12/14/2014 9:18 AM

## 2014-12-15 NOTE — Telephone Encounter (Signed)
Medication refilled per protocol. 

## 2014-12-17 ENCOUNTER — Other Ambulatory Visit: Payer: Self-pay | Admitting: Physician Assistant

## 2014-12-17 NOTE — Telephone Encounter (Signed)
Medication refilled per protocol. 

## 2015-06-01 ENCOUNTER — Other Ambulatory Visit: Payer: Self-pay | Admitting: Physician Assistant

## 2015-06-01 ENCOUNTER — Encounter: Payer: Self-pay | Admitting: Family Medicine

## 2015-06-01 NOTE — Telephone Encounter (Signed)
Medication refill for one time only.  Patient needs to be seen.  Letter sent for patient to call and schedule 

## 2015-06-10 ENCOUNTER — Ambulatory Visit (INDEPENDENT_AMBULATORY_CARE_PROVIDER_SITE_OTHER): Payer: Managed Care, Other (non HMO) | Admitting: Physician Assistant

## 2015-06-10 ENCOUNTER — Encounter: Payer: Self-pay | Admitting: Physician Assistant

## 2015-06-10 VITALS — BP 134/84 | HR 60 | Temp 98.3°F | Resp 18 | Wt 211.0 lb

## 2015-06-10 DIAGNOSIS — I10 Essential (primary) hypertension: Secondary | ICD-10-CM | POA: Diagnosis not present

## 2015-06-10 LAB — BASIC METABOLIC PANEL WITH GFR
BUN: 16 mg/dL (ref 7–25)
CHLORIDE: 99 mmol/L (ref 98–110)
CO2: 30 mmol/L (ref 20–31)
Calcium: 10 mg/dL (ref 8.6–10.4)
Creat: 0.8 mg/dL (ref 0.50–1.05)
GFR, Est African American: >89 mL/min (ref >=60)
GFR, Est Non African American: 83 mL/min (ref >=60)
Glucose, Bld: 88 mg/dL (ref 70–99)
Potassium: 3.4 mmol/L — ABNORMAL LOW (ref 3.5–5.3)
Sodium: 140 mmol/L (ref 135–146)

## 2015-06-10 MED ORDER — HYDROCHLOROTHIAZIDE 25 MG PO TABS
25.0000 mg | ORAL_TABLET | Freq: Every day | ORAL | Status: DC
Start: 2015-06-10 — End: 2016-02-23

## 2015-06-10 MED ORDER — METOPROLOL TARTRATE 25 MG PO TABS: ORAL_TABLET | ORAL | Status: DC

## 2015-06-10 MED ORDER — BENAZEPRIL HCL 10 MG PO TABS: 10.0000 mg | ORAL_TABLET | Freq: Every day | ORAL | Status: DC

## 2015-06-10 NOTE — Progress Notes (Signed)
Patient ID: Rachel Estes MRN: [id number], DOB: 04-07-1961, 53 y.o. Date of Encounter: 06/10/2015, 12:14 PM    Chief Complaint:  Chief Complaint  Patient presents with  . BP follow up     HPI: 54 y.o. year old AA female here for followup of her hypertension.  She is taking all 3 blood pressure medications as directed. No adverse effects. No lightheadedness. No lower extremity edema. She says that she does check her blood pressure at home and gets good readings--- usually around 130/80s.  She has no complaints today.  She has a daughter who is in college in [place] [employer]. 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  At OV 11/2014--says she is doing research this summer--sting rays, shrimp,etc Her son just graduated from [employer] December 2015. Says that he actually is at home with her right now. Says that he was a communications major. Says that she is trying to get him to realize that he has got to "get it together' At OV 11/2014 she says that she has continued to keep her diet about the same as it has been for the past couple years. Says that with her son at home he is always eating chicken [place] or fish. Frequently makes smoothies with spinach kale and fruit. Patient does not work. 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   Home Meds:    Outpatient Prescriptions Prior to Visit  Medication Sig Dispense Refill  . benazepril (LOTENSIN) 10 MG tablet TAKE 1 TABLET BY MOUTH EVERY DAY 90 tablet 0  . hydrochlorothiazide (HYDRODIURIL) 25 MG tablet TAKE 1 TABLET BY MOUTH EVERY DAY 90 tablet 0  . metoprolol tartrate (LOPRESSOR) 25 MG tablet TAKE 0.5 TABLETS (12.5 MG TOTAL) BY MOUTH 2 (TWO) TIMES DAILY. 90 tablet 1   No facility-administered medications prior to visit.     Allergies: No Known Allergies    Review of Systems: See HPI for  pertinent ROS. All other ROS negative.    Physical Exam: Blood pressure 134/84, pulse 60, temperature 98.3 F (36.8 C), temperature source Oral, resp. rate 18, weight 211 lb (95.709 kg)., Body mass index is 36.2 kg/(m^2). General: An obese African American female. Appears in no acute distress. Neck: Supple. No thyromegaly. No lymphadenopathy. No carotid bruit. Lungs: Clear bilaterally to auscultation without wheezes, rales, or rhonchi. Breathing is unlabored. Heart: Regular rhythm. No murmurs, rubs, or gallops. Msk:  Strength and tone normal for age. Extremities/Skin: Warm and dry. No edema.  Neuro: Alert and oriented X 3. Moves all extremities spontaneously. Gait is normal. CNII-XII grossly in tact. Psych:  Responds to questions appropriately with a normal affect.     ASSESSMENT AND PLAN:  54 y.o. year old female with  1. Hypertension Blood pressure control. Continue current medications. Check labs monitor. - BASIC METABOLIC PANEL WITH GFR - metoprolol tartrate (LOPRESSOR) 25 MG tablet; TAKE 1/2 TABLET BY MOUTH TWICE A DAY  Dispense: 90 tablet; Refill: 1 - hydrochlorothiazide (HYDRODIURIL) 25 MG tablet; Take 1 tablet (25 mg total) by mouth daily.  Dispense: 90 tablet; Refill: 1 - benazepril (LOTENSIN) 10 MG tablet; TAKE 1 TABLET BY MOUTH EVERY DAY  Dispense: 90 tablet; Refill: 1  2. obesity: We had discussed need for improved diet and need for exercise in the past. At OV 05/2013 her weight was down 4 pounds compared  to 12/11/12 Since then, weigth has remained stable.  05/2013-----214 lb 11/2013-------216 lb 05/2014------216 lb 05/2015-----211  Lb.  3. Lipids: Her only cardiac risk factors are hypertension and obesity.  Therefore she has not required medication for cholesterol.  Last lipid panel was performed 12/09/12. This showed cholesterol 198, triglyceride 180, HDL 39, LDL 123. Prior to that she had improved her some diet somewhat. Today she says that she has stuck with diet  changes and feels that her diet is as good as it was at that time.  Therefore we can wait to recheck lipid panel. Feel that this should be stable compared to the numbers from June 2014. See HPI regarding diet 11/2014   4. Mammogram: Patient has had followup this year.  5. colonoscopy: At age 54. Repeat 10 years. Will be due at age 54. Pt aware this is due in 2016--she will f/u with this. At OV 11/2014--says that she has received a letter from GI reminding her to schedule this and she plans to follow-up with this.  6. Immunizations: Tetanus: She had this 2007. Up to date. Influenza vaccine: I discussed at OV 05/2013.Adnat OV 05/2014.  She deferred both times--again today.   7. History of Hysterectomy.  Routine F/U Office Visit  6 Months, sooner if needed.  [address]igned, Rachel Estes, [place], [facility] 06/10/2015 12:14 PM

## 2015-07-01 LAB — HM COLONOSCOPY

## 2015-09-21 ENCOUNTER — Other Ambulatory Visit: Payer: Self-pay

## 2015-09-21 DIAGNOSIS — Z1231 Encounter for screening mammogram for malignant neoplasm of breast: Secondary | ICD-10-CM

## 2015-10-11 ENCOUNTER — Ambulatory Visit
Admission: RE | Admit: 2015-10-11 | Discharge: 2015-10-11 | Disposition: A | Discharge status: Home / Self Care | Payer: 59 | Source: Ambulatory Visit

## 2015-10-11 DIAGNOSIS — Z1231 Encounter for screening mammogram for malignant neoplasm of breast: Secondary | ICD-10-CM

## 2015-12-08 ENCOUNTER — Encounter: Payer: Self-pay | Admitting: Family Medicine

## 2015-12-08 ENCOUNTER — Other Ambulatory Visit: Payer: Self-pay | Admitting: Physician Assistant

## 2015-12-08 NOTE — Telephone Encounter (Signed)
Medication refill for one time only.  Patient needs to be seen.  Letter sent for patient to call and schedule 

## 2015-12-27 ENCOUNTER — Encounter: Payer: Self-pay | Admitting: Physician Assistant

## 2015-12-27 ENCOUNTER — Ambulatory Visit (INDEPENDENT_AMBULATORY_CARE_PROVIDER_SITE_OTHER): Payer: Managed Care, Other (non HMO) | Admitting: Physician Assistant

## 2015-12-27 VITALS — BP 132/88 | HR 68 | Temp 98.2°F | Resp 18 | Wt 211.0 lb

## 2015-12-27 DIAGNOSIS — I10 Essential (primary) hypertension: Secondary | ICD-10-CM

## 2015-12-27 DIAGNOSIS — E669 Obesity, unspecified: Secondary | ICD-10-CM

## 2015-12-27 NOTE — Progress Notes (Signed)
Patient ID: Rachel Estes MRN: [id number], DOB: 12/25/1960, 55 y.o. Date of Encounter: 12/27/2015, 10:56 AM    Chief Complaint:  Chief Complaint  Patient presents with  . 6 mth check up    is fasting     HPI: 55 y.o. year old AA female here for followup of her hypertension.    She has a daughter who is in college in [place] [employer]. 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 Said that he actually is at home with her right now. Says that he was a communications major. Says that she is trying to get him to realize that he has got to "get it together' At OV 12/2015--- says that he has gotten a job in [place]----says that it is a company that does Product/process development scientistmedical software. Says that he is able to look at apartments online etc. Starts the job in October. Says that he knows to friends who work there as well. Says that she has learned that apparently there is a lot of young people in that area and that he should be happy. Do not know until her conversation today that she is actually from [place] and her family is in [place]. Told her that we are going to visit my brother in [place].  At OV 11/2014 and again at OV 12/2015--she says that she has continued to keep her diet about the same as it has been for the past couple years. Says that with her son at home he is always eating chicken [place]key or fish. Frequently makes smoothies with spinach kale and fruit. Patient does not work. 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   She is taking all 3 blood pressure medications as directed. No adverse effects. No  lightheadedness. No lower extremity edema. She says that she does check her blood pressure at home and gets good readings--- usually around 130/80s.  She has no complaints today.    Home Meds:    Outpatient Prescriptions Prior to Visit  Medication Sig Dispense Refill  . benazepril (LOTENSIN) 10 MG tablet Take 1 tablet (10 mg total) by mouth daily. 90 tablet 1  . hydrochlorothiazide (HYDRODIURIL) 25 MG tablet Take 1 tablet (25 mg total) by mouth daily. 90 tablet 1  . metoprolol tartrate (LOPRESSOR) 25 MG tablet TAKE 0.5 TABLETS (12.5 MG TOTAL) BY MOUTH 2 (TWO) TIMES DAILY. 90 tablet 0   No facility-administered medications prior to visit.     Allergies: No Known Allergies    Review of Systems: See HPI for pertinent ROS. All other ROS negative.    Physical Exam: Blood pressure 132/88, pulse 68, temperature 98.2 F (36.8 C), temperature source Oral, resp. rate 18, weight 211 lb (95.709 kg)., Body mass index is 36.2 kg/(m^2). General: An obese African American female. Appears in no acute distress. Neck: Supple. No thyromegaly. No lymphadenopathy. No carotid bruit. Lungs: Clear bilaterally to auscultation without wheezes, rales, or rhonchi. Breathing is unlabored. Heart: Regular rhythm. No murmurs, rubs, or gallops. Msk:  Strength and tone normal for age. Extremities/Skin: Warm and dry. No edema.  Neuro: Alert and oriented X  3. Moves all extremities spontaneously. Gait is normal. CNII-XII grossly in tact. Psych:  Responds to questions appropriately with a normal affect.     ASSESSMENT AND PLAN:  55 y.o. year old female with  1. Hypertension Blood pressure control. Continue current medications. Check labs monitor. - BASIC METABOLIC PANEL WITH GFR - metoprolol tartrate (LOPRESSOR) 25 MG tablet; TAKE 1/2 TABLET BY MOUTH TWICE A DAY  Dispense: 90 tablet; Refill: 1 - hydrochlorothiazide (HYDRODIURIL) 25 MG tablet; Take 1 tablet (25 mg total) by mouth daily.  Dispense: 90 tablet;  Refill: 1 - benazepril (LOTENSIN) 10 MG tablet; TAKE 1 TABLET BY MOUTH EVERY DAY  Dispense: 90 tablet; Refill: 1  2. obesity: We had discussed need for improved diet and need for exercise in the past. At OV 05/2013 her weight was down 4 pounds compared to 12/11/12 Since then, weigth has remained stable.  05/2013-----214 lb 11/2013-------216 lb 05/2014------216 lb 05/2015-----211  Lb. 12/2015-------211 lb 3. Lipids: Her only cardiac risk factors are hypertension and obesity.  Therefore she has not required medication for cholesterol.  Last lipid panel was performed 12/09/12. This showed cholesterol 198, triglyceride 180, HDL 39, LDL 123. Prior to that she had improved her some diet somewhat. Today she says that she has stuck with diet changes and feels that her diet is as good as it was at that time.  Therefore we can wait to recheck lipid panel. Feel that this should be stable compared to the numbers from June 2014. See HPI regarding diet 11/2014   4. Mammogram: Patient has had followup this year.  5. colonoscopy: At age 89. Repeat 10 years. Will be due at age 5. Pt aware this is due in 2016--she will f/u with this. At OV 11/2014--says that she has received a letter from GI reminding her to schedule this and she plans to follow-up with this. OV 12/2015 she states that she did follow-up with colonoscopy. Was told she can wait 10 years to repeat.  6. Immunizations: Tetanus: She had this 2007. Up to date. Influenza vaccine: I discussed at OV 05/2013.Adnat OV 05/2014.  She deferred both times--again today.   7. History of Hysterectomy.  Routine F/U Office Visit  6 Months, sooner if needed.  Signed, [address] [place], [place], [facility] 12/27/2015 10:56 AM

## 2015-12-28 ENCOUNTER — Encounter: Payer: Self-pay | Admitting: Family Medicine

## 2015-12-28 LAB — BASIC METABOLIC PANEL WITH GFR
BUN: 16 mg/dL (ref 7–25)
CHLORIDE: 100 mmol/L (ref 98–110)
CO2: 29 mmol/L (ref 20–31)
CREATININE: 0.85 mg/dL (ref 0.50–1.05)
Calcium: 9.6 mg/dL (ref 8.6–10.4)
GFR, Est African American: 89 mL/min (ref >=60)
GFR, Est Non African American: 77 mL/min (ref >=60)
Glucose, Bld: 91 mg/dL (ref 70–99)
POTASSIUM: 4 mmol/L (ref 3.5–5.3)
SODIUM: 142 mmol/L (ref 135–146)

## 2016-02-23 ENCOUNTER — Other Ambulatory Visit: Payer: Self-pay | Admitting: Physician Assistant

## 2016-02-23 DIAGNOSIS — I10 Essential (primary) hypertension: Secondary | ICD-10-CM

## 2016-03-13 ENCOUNTER — Other Ambulatory Visit: Payer: Self-pay | Admitting: Physician Assistant

## 2016-06-11 ENCOUNTER — Other Ambulatory Visit: Payer: Self-pay | Admitting: Physician Assistant

## 2016-06-15 NOTE — Telephone Encounter (Signed)
rx filled per protocol  

## 2016-06-16 ENCOUNTER — Other Ambulatory Visit: Payer: Self-pay

## 2016-06-16 MED ORDER — METOPROLOL TARTRATE 25 MG PO TABS: 12.5000 mg | ORAL_TABLET | Freq: Two times a day (BID) | ORAL | 0 refills | Status: DC

## 2016-06-16 NOTE — Telephone Encounter (Signed)
rx filled per protocol Pt need follow up office visit letter mailed

## 2016-07-17 ENCOUNTER — Ambulatory Visit (INDEPENDENT_AMBULATORY_CARE_PROVIDER_SITE_OTHER): Payer: Managed Care, Other (non HMO) | Admitting: Physician Assistant

## 2016-07-17 ENCOUNTER — Encounter: Payer: Self-pay | Admitting: Physician Assistant

## 2016-07-17 VITALS — BP 130/86 | HR 61 | Temp 98.1°F | Resp 16 | Wt 212.4 lb

## 2016-07-17 DIAGNOSIS — I10 Essential (primary) hypertension: Secondary | ICD-10-CM

## 2016-07-17 LAB — BASIC METABOLIC PANEL WITHOUT GFR
BUN: 12 mg/dL (ref 7–25)
CO2: 30 mmol/L (ref 20–31)
Calcium: 10.2 mg/dL (ref 8.6–10.4)
Chloride: 100 mmol/L (ref 98–110)
Creat: 0.8 mg/dL (ref 0.50–1.05)
GFR, Est African American: >89 mL/min
GFR, Est Non African American: 82 mL/min
Glucose, Bld: 96 mg/dL (ref 70–99)
Potassium: 3.6 mmol/L (ref 3.5–5.3)
Sodium: 142 mmol/L (ref 135–146)

## 2016-07-17 NOTE — Progress Notes (Signed)
Patient ID: ASLYN COTTMAN MRN: [id number], DOB: [birth year], 58 y.o. Date of Encounter: 07/17/2016, 11:39 AM    Chief Complaint:  Chief Complaint  Patient presents with  . Hypertension  . left ear clogged     HPI: 56 y.o. year old AA female here for followup of her hypertension.    She has a daughter who is in college in [place] [employer]. 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 Said that he actually is at home with her right now. Says that he was a communications major. Says that she is trying to get him to realize that he has got to "get it together' At OV 12/2015--- says that he has gotten a job in [place]----says that it is a company that does Product/process development scientist. Says that he is able to look at apartments online etc. Starts the job in October. Says that he knows to friends who work there as well. Says that she has learned that apparently there is a lot of young people in that area and that he should be happy. Do not know until her conversation today that she is actually from [place] and [place] in [place]. Told her that we are going to visit my brother in [place]. At OV 06/2016---says her daughter has gotten interviews for 2 Vet schools-----hoping she will get in / accepted.   At OV 11/2014 and again at OV 12/2015--she says that she has continued to keep her diet about the same as it has been for the past couple years. Says that with her son at home he is always eating chicken [place] or fish. Frequently makes smoothies with spinach kale and fruit. Patient does not work. 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  At OV 06/2016-- She is taking all 3 blood pressure medications as directed. No adverse effects. No lightheadedness. No lower extremity edema. She says that she does check her blood pressure at home and gets good readings--- usually around 130/80s. Says she thinks her left ear is clogged with wax again. She has no other concerns today.    Home Meds:    Outpatient Medications Prior to Visit  Medication Sig Dispense Refill  . benazepril (LOTENSIN) 10 MG tablet TAKE 1 TABLET (10 MG TOTAL) BY MOUTH DAILY. 90 tablet 1  . hydrochlorothiazide (HYDRODIURIL) 25 MG tablet TAKE 1 TABLET (25 MG TOTAL) BY MOUTH DAILY. 90 tablet 1  . metoprolol tartrate (LOPRESSOR) 25 MG tablet Take 0.5 tablets (12.5 mg total) by mouth 2 (two) times daily. 90 tablet 0   No facility-administered medications prior to visit.      Allergies: No Known Allergies    Review of Systems: See HPI for pertinent ROS. All other ROS negative.    Physical Exam: Blood pressure 130/86, pulse 61, temperature 98.1 F (36.7 C), temperature source Oral, resp. rate 16, weight 212 lb 6.4 oz (96.3 kg), SpO2 98 %., Body mass index is 36.46 kg/m. General: An obese African American female. Appears in no acute distress. EARS---Right Ear Canal patent.  Left Ear Canal obstructed with cerumen Neck: Supple. No thyromegaly.  No lymphadenopathy. No carotid bruit. Lungs: Clear bilaterally to auscultation without wheezes, rales, or rhonchi. Breathing is unlabored. Heart: Regular rhythm. No murmurs, rubs, or gallops. Msk:  Strength and tone normal for age. Extremities/Skin: Warm and dry. No edema.  Neuro: Alert and oriented X 3. Moves all extremities spontaneously. Gait is normal. CNII-XII grossly in tact. Psych:  Responds to questions appropriately with a normal affect.     ASSESSMENT AND PLAN:  56 y.o. year old female with  Left Cerumen Impaction --Irrigate now.  Use otc drops to prevent re-occurrence.      Hypertension At OV 06/2016--Blood pressure control. Continue current medications. Check labs monitor. - BASIC METABOLIC PANEL WITH GFR - metoprolol tartrate (LOPRESSOR) 25 MG tablet; TAKE 1/2 TABLET BY MOUTH TWICE A DAY  Dispense: 90 tablet; Refill: 1 - hydrochlorothiazide (HYDRODIURIL) 25 MG tablet; Take 1 tablet (25 mg total) by mouth daily.  Dispense: 90 tablet; Refill: 1 - benazepril (LOTENSIN) 10 MG tablet; TAKE 1 TABLET BY MOUTH EVERY DAY  Dispense: 90 tablet; Refill: 1  Obesity:  At OV 06/2016--We had discussed need for improved diet and need for exercise in the past. At OV 05/2013 her weight was down 4 pounds compared to 12/11/12 Since then, weigth has remained stable.  05/2013-----214 lb 11/2013-------216 lb 05/2014------216 lb 05/2015-----211  Lb. 12/2015-------211 lb At OV 06/2016-- 212.4 lb  Lipids: Her only cardiac risk factors are hypertension and obesity.  Therefore she has not required medication for cholesterol.  Last lipid panel was performed 12/09/12. This showed cholesterol 198, triglyceride 180, HDL 39, LDL 123. Prior to that she had improved her some diet somewhat. Today she says that she has stuck with diet changes and feels that her diet is as good as it was at that time.  Therefore we can wait to recheck lipid panel. Feel that this should be stable compared to the numbers from June 2014. See HPI regarding diet 11/2014   Mammogram: Patient has had followup this year.  Colonoscopy: At age 56. Repeat 10 years. Will be due at age 56. Pt aware this is due in 2016--she will f/u with this. At OV 11/2014--says that she has received a letter from GI reminding her to schedule this and she plans to follow-up with this. OV 12/2015 she states that she did follow-up with colonoscopy. Was told she can wait 10 years to repeat.  Immunizations: Tetanus: She had this 2007. Up to date. Influenza vaccine: I discussed at OV 05/2013.Adnat OV 05/2014.  She deferred both times--again today.    History of Hysterectomy.  Routine F/U Office Visit  6 Months, sooner if needed.  Signed, [address], [place], [facility] 07/17/2016 11:39 AM

## 2016-08-20 ENCOUNTER — Other Ambulatory Visit: Payer: Self-pay | Admitting: Physician Assistant

## 2016-08-20 DIAGNOSIS — I10 Essential (primary) hypertension: Secondary | ICD-10-CM

## 2016-08-21 NOTE — Telephone Encounter (Signed)
Refill appropriate 

## 2016-09-26 ENCOUNTER — Other Ambulatory Visit: Payer: Self-pay | Admitting: Family Medicine

## 2016-09-26 DIAGNOSIS — Z1231 Encounter for screening mammogram for malignant neoplasm of breast: Secondary | ICD-10-CM

## 2016-10-16 ENCOUNTER — Ambulatory Visit
Admission: RE | Admit: 2016-10-16 | Discharge: 2016-10-16 | Disposition: A | Discharge status: Home / Self Care | Payer: 59 | Source: Ambulatory Visit | Attending: Family Medicine | Admitting: Family Medicine

## 2016-10-16 DIAGNOSIS — Z1231 Encounter for screening mammogram for malignant neoplasm of breast: Secondary | ICD-10-CM

## 2016-12-09 ENCOUNTER — Other Ambulatory Visit: Payer: Self-pay | Admitting: Physician Assistant

## 2017-02-21 ENCOUNTER — Other Ambulatory Visit: Payer: Self-pay | Admitting: Physician Assistant

## 2017-02-21 DIAGNOSIS — I10 Essential (primary) hypertension: Secondary | ICD-10-CM

## 2017-03-12 ENCOUNTER — Other Ambulatory Visit: Payer: Self-pay | Admitting: Physician Assistant

## 2017-03-12 NOTE — Telephone Encounter (Signed)
Patient due for an office visit letter mailed  

## 2017-04-02 ENCOUNTER — Ambulatory Visit (INDEPENDENT_AMBULATORY_CARE_PROVIDER_SITE_OTHER): Payer: Self-pay | Admitting: Physician Assistant

## 2017-04-02 ENCOUNTER — Encounter: Payer: Self-pay | Admitting: Physician Assistant

## 2017-04-02 VITALS — BP 132/80 | HR 86 | Temp 98.5°F | Resp 16 | Ht 64.0 in | Wt 211.0 lb

## 2017-04-02 DIAGNOSIS — Z114 Encounter for screening for human immunodeficiency virus [HIV]: Secondary | ICD-10-CM

## 2017-04-02 DIAGNOSIS — I10 Essential (primary) hypertension: Secondary | ICD-10-CM

## 2017-04-02 DIAGNOSIS — Z1159 Encounter for screening for other viral diseases: Secondary | ICD-10-CM

## 2017-04-02 DIAGNOSIS — Z0000 Encounter for general adult medical examination without abnormal findings: Secondary | ICD-10-CM

## 2017-04-02 NOTE — Progress Notes (Signed)
Patient ID: Rachel Estes MRN: [id number], DOB: 09/22/1960, 56 y.o. Date of Encounter: 04/02/2017, 11:53 AM    Chief Complaint:  Chief Complaint  Patient presents with  . Follow-up    is fasting     HPI: 56 y.o. year old AA female here for followup of her hypertension.   "Social History": She has a daughter who is in college in [place] [employer]. 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 Said that he actually is at home with her right now. Says that he was a communications major. Says that she is trying to get him to realize that he has got to "get it together' At OV 12/2015--- says that he has gotten a job in [place]----says that it is a company that does Product/process development scientist. Says that he is able to look at apartments online etc. Starts the job in October. Says that he knows to friends who work there as well. Says that she has learned that apparently there is a lot of young people in that area and that he should be happy. Do not know until her conversation today that she is actually from [place] and her family is in [place]. Told her that we are going to visit my brother in [place]. At OV 06/2016---says her daughter has gotten interviews for 2 Vet schools-----hoping she will get in / accepted.  At OV 04/02/2017: her daughter was accepted to a International aid/development worker school in [place]. She is in her first semester there.  --------------- she reports that her son is still working with the company that does Product/process development scientist in [place]. Says that he has done some traveling with his job to [facility] in several other locations. Says that he is still happy with his job.        ---She shows me a  picture on her phone of 2 little fluffy dogs--- those were her "mothers day gift"       ----she also states that she has been doing craft projects with a group on a regular basis. Has to set up for 150--!!!  At OV 11/2014 and again at OV 12/2015--she says that she has continued to keep her diet about the same as it has been for the past couple years. Says that with her son at home he is always eating chicken [place] or fish. Frequently makes smoothies with spinach kale and fruit. Patient does not work. 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  At OV 04/02/2017-- She is taking all 3 blood pressure medications as directed. No adverse effects. No lightheadedness. No lower extremity edema. She says that she does check her blood pressure at home and gets good readings   Home Meds:    Outpatient Medications Prior to Visit  Medication Sig Dispense Refill  . benazepril (LOTENSIN) 10 MG tablet TAKE 1 TABLET (10 MG TOTAL) BY MOUTH DAILY. 90 tablet 0  . hydrochlorothiazide (HYDRODIURIL) 25 MG tablet TAKE 1 TABLET (25 MG TOTAL) BY MOUTH DAILY. 90 tablet 0  . metoprolol tartrate (LOPRESSOR) 25  MG tablet TAKE 1/2 TABLET TWICE DAILY 60 tablet 0   No facility-administered medications prior to visit.      Allergies: No Known Allergies    Review of Systems: See HPI for pertinent ROS. All other ROS negative.    Physical Exam: Blood pressure 132/80, pulse 86, temperature 98.5 F (36.9 C), temperature source Oral, resp. rate 16, height  (1.626 m), weight 95.7 kg (211 lb), SpO2 99 %., Body mass index is 36.22 kg/m. General: An obese African American female. Appears in no acute distress. Neck: Supple. No thyromegaly. No lymphadenopathy. No carotid bruit. Lungs: Clear bilaterally to auscultation without wheezes, rales, or rhonchi. Breathing is unlabored. Heart: Regular rhythm. No  murmurs, rubs, or gallops. Msk:  Strength and tone normal for age. Extremities/Skin: Warm and dry. No edema.  Neuro: Alert and oriented X 3. Moves all extremities spontaneously. Gait is normal. CNII-XII grossly in tact. Psych:  Responds to questions appropriately with a normal affect.     ASSESSMENT AND PLAN:  56 y.o. year old female with  Hypertension At OV 04/02/2017--Blood pressure controlled. Continue current medications. Check labs monitor. - BASIC METABOLIC PANEL WITH GFR - metoprolol tartrate (LOPRESSOR) 25 MG tablet; TAKE 1/2 TABLET BY MOUTH TWICE A DAY  Dispense: 90 tablet; Refill: 1 - hydrochlorothiazide (HYDRODIURIL) 25 MG tablet; Take 1 tablet (25 mg total) by mouth daily.  Dispense: 90 tablet; Refill: 1 - benazepril (LOTENSIN) 10 MG tablet; TAKE 1 TABLET BY MOUTH EVERY DAY  Dispense: 90 tablet; Refill: 1  Need for hepatitis C screening test - Hepatitis C antibody  Encounter for screening for HIV - HIV antibody   04/02/2017---noted that she has not had a complete physical exam recently. She is agreeable to schedule a complete physical exam in the next 1-2 weeks. She is fasting today. Therefore will go ahead and check labs today and she will return for follow-up CPE in the next 1-2 weeks.  Encounter for preventive health examination - CBC with Differential/Platelet - Lipid panel - Hepatic function panel - TSH   ------------------------------------------------------------------------------------------------------------------------------------------------------------------------------------------------------------------------    Obesity:  At OV 06/2016--We had discussed need for improved diet and need for exercise in the past. At OV 05/2013 her weight was down 4 pounds compared to 12/11/12 Since then, weigth has remained stable.  05/2013-----214 lb 11/2013-------216 lb 05/2014------216 lb 05/2015-----211  Lb. 12/2015-------211 lb At OV 06/2016-- 212.4  lb 04/02/2017-----211 lb  Lipids: Her only cardiac risk factors are hypertension and obesity.  Therefore she has not required medication for cholesterol.  Last lipid panel was performed 12/09/12. This showed cholesterol 198, triglyceride 180, HDL 39, LDL 123. Prior to that she had improved her some diet somewhat. Today she says that she has stuck with diet changes and feels that her diet is as good as it was at that time.  Therefore we can wait to recheck lipid panel. Feel that this should be stable compared to the numbers from June 2014. See HPI regarding diet 11/2014   Mammogram: Patient has had followup this year.  Colonoscopy: At age 34. Repeat 10 years. Will be due at age 89. Pt aware this is due in 2016--she will f/u with this. At OV 11/2014--says that she has received a letter from GI reminding her to schedule this and she plans to follow-up with this. OV 12/2015 she states that she did follow-up with colonoscopy. Was told she can wait 10 years to repeat.  Immunizations: Tetanus: She had this 2007. Up to date. Influenza vaccine: I discussed  at [facility] 05/2013.Adnat OV 05/2014.  She deferred both times--again today.   History of Hysterectomy.   Signed, [address] [place], [place], [facility] 04/02/2017 11:53 AM

## 2017-04-03 LAB — HEPATIC FUNCTION PANEL
AG Ratio: 1.7 (calc) (ref 1.0–2.5)
ALT: 21 U/L (ref 6–29)
AST: 20 U/L (ref 10–35)
Albumin: 4.5 g/dL (ref 3.6–5.1)
Alkaline phosphatase (APISO): 95 U/L (ref 33–130)
BILIRUBIN DIRECT: 0.1 mg/dL (ref 0.0–0.2)
BILIRUBIN INDIRECT: 0.4 mg/dL (ref 0.2–1.2)
BILIRUBIN TOTAL: 0.5 mg/dL (ref 0.2–1.2)
Globulin: 2.7 g/dL (calc) (ref 1.9–3.7)
Total Protein: 7.2 g/dL (ref 6.1–8.1)

## 2017-04-03 LAB — LIPID PANEL
CHOL/HDL RATIO: 4.6 (calc) (ref <5.0)
Cholesterol: 196 mg/dL (ref <200)
HDL: 43 mg/dL — ABNORMAL LOW (ref >50)
LDL Cholesterol (Calc): 129 mg/dL (calc) — ABNORMAL HIGH
Non-HDL Cholesterol (Calc): 153 mg/dL (calc) — ABNORMAL HIGH (ref <130)
TRIGLYCERIDES: 126 mg/dL (ref <150)

## 2017-04-03 LAB — CBC WITH DIFFERENTIAL/PLATELET
Basophils Absolute: 29 cells/uL (ref 0–200)
Basophils Relative: 0.3 %
EOS ABS: 58 {cells}/uL (ref 15–500)
Eosinophils Relative: 0.6 %
HCT: 44.1 % (ref 35.0–45.0)
HEMOGLOBIN: 14.5 g/dL (ref 11.7–15.5)
Lymphs Abs: 2367 cells/uL (ref 850–3900)
MCH: 27.9 pg (ref 27.0–33.0)
MCHC: 32.9 g/dL (ref 32.0–36.0)
MCV: 84.8 fL (ref 80.0–100.0)
MPV: 11 fL (ref 7.5–12.5)
Monocytes Relative: 6.5 %
NEUTROS ABS: 6615 {cells}/uL (ref 1500–7800)
Neutrophils Relative %: 68.2 %
Platelets: 281 10*3/uL (ref 140–400)
RBC: 5.2 10*6/uL — AB (ref 3.80–5.10)
RDW: 12.8 % (ref 11.0–15.0)
TOTAL LYMPHOCYTE: 24.4 %
WBC: 9.7 10*3/uL (ref 3.8–10.8)
WBCMIX: 631 {cells}/uL (ref 200–950)

## 2017-04-03 LAB — BASIC METABOLIC PANEL WITH GFR
BUN: 11 mg/dL (ref 7–25)
CALCIUM: 10.1 mg/dL (ref 8.6–10.4)
CO2: 31 mmol/L (ref 20–32)
CREATININE: 0.88 mg/dL (ref 0.50–1.05)
Chloride: 101 mmol/L (ref 98–110)
GFR, EST NON AFRICAN AMERICAN: 73 mL/min/{1.73_m2} (ref >=60)
GFR, Est African American: 85 mL/min/{1.73_m2} (ref >=60)
GLUCOSE: 98 mg/dL (ref 65–99)
Potassium: 3.6 mmol/L (ref 3.5–5.3)
SODIUM: 144 mmol/L (ref 135–146)

## 2017-04-03 LAB — HEPATITIS C ANTIBODY
Hepatitis C Ab: NONREACTIVE
SIGNAL TO CUT-OFF: 0.01 (ref <1.00)

## 2017-04-03 LAB — HIV ANTIBODY (ROUTINE TESTING W REFLEX): HIV: NONREACTIVE

## 2017-04-03 LAB — TSH: TSH: 0.95 mIU/L (ref 0.40–4.50)

## 2017-04-09 ENCOUNTER — Other Ambulatory Visit: Payer: Self-pay

## 2017-04-09 MED ORDER — METOPROLOL TARTRATE 25 MG PO TABS: 12.5000 mg | ORAL_TABLET | Freq: Two times a day (BID) | ORAL | 0 refills | Status: DC

## 2017-04-16 ENCOUNTER — Encounter: Payer: Self-pay | Admitting: Physician Assistant

## 2017-04-16 ENCOUNTER — Ambulatory Visit (INDEPENDENT_AMBULATORY_CARE_PROVIDER_SITE_OTHER): Payer: 59 | Admitting: Physician Assistant

## 2017-04-16 VITALS — BP 136/84 | HR 75 | Temp 97.7°F | Resp 16 | Ht 64.0 in | Wt 212.0 lb

## 2017-04-16 DIAGNOSIS — Z0000 Encounter for general adult medical examination without abnormal findings: Secondary | ICD-10-CM | POA: Diagnosis not present

## 2017-04-16 DIAGNOSIS — Z23 Encounter for immunization: Secondary | ICD-10-CM

## 2017-04-16 NOTE — Progress Notes (Addendum)
Patient ID: Rachel Estes MRN: [id number], DOB: December 20, 1960, 56 y.o. Date of Encounter: 04/16/2017,   Chief Complaint: Physical (CPE)  HPI: 56 y.o. y/o female  here for CPE.   She was just recently here for routine visit. She has no concerns to address today. Is just here for CPE to update preventive care.  Review of Systems: Consitutional: No fever, chills, fatigue, night sweats, lymphadenopathy. No significant/unexplained weight changes. Eyes: No visual changes, eye redness, or discharge. ENT/Mouth: No ear pain, sore throat, nasal drainage, or sinus pain. Cardiovascular: No chest pressure,heaviness, tightness or squeezing, even with exertion. No increased shortness of breath or dyspnea on exertion.No palpitations, edema, orthopnea, PND. Respiratory: No cough, hemoptysis, SOB, or wheezing. Gastrointestinal: No anorexia, dysphagia, reflux, pain, nausea, vomiting, hematemesis, diarrhea, constipation, BRBPR, or melena. Breast: No mass, nodules, bulging, or retraction. No skin changes or inflammation. No nipple discharge. No lymphadenopathy. Genitourinary: No dysuria, hematuria, incontinence, vaginal discharge, pruritis, burning, abnormal bleeding, or pain. Musculoskeletal: No decreased ROM, No joint pain or swelling. No significant pain in neck, back, or extremities. Skin: No rash, pruritis, or concerning lesions. Neurological: No headache, dizziness, syncope, seizures, tremors, memory loss, coordination problems, or paresthesias. Psychological: No anxiety, depression, hallucinations, SI/HI. Endocrine: No polydipsia, polyphagia, polyuria, or known diabetes.No increased fatigue. No palpitations/rapid heart rate. No significant/unexplained weight change. All other systems were reviewed and are otherwise negative.  Past Medical History:  Diagnosis Date  . Hypertension      No past surgical history on file.  Home Meds:  Outpatient Medications Prior to Visit  Medication Sig Dispense  Refill  . benazepril (LOTENSIN) 10 MG tablet TAKE 1 TABLET (10 MG TOTAL) BY MOUTH DAILY. 90 tablet 0  . hydrochlorothiazide (HYDRODIURIL) 25 MG tablet TAKE 1 TABLET (25 MG TOTAL) BY MOUTH DAILY. 90 tablet 0  . metoprolol tartrate (LOPRESSOR) 25 MG tablet Take 0.5 tablets (12.5 mg total) by mouth 2 (two) times daily. 180 tablet 0   No facility-administered medications prior to visit.     Allergies: No Known Allergies  Social History   Social History  . Marital status: Married    Spouse name: N/A  . Number of children: N/A  . Years of education: N/A   Occupational History  . Not on file.   Social History Main Topics  . Smoking status: Never Smoker  . Smokeless tobacco: Never Used  . Alcohol use No  . Drug use: No  . Sexual activity: Not on file   Other Topics Concern  . Not on file   Social History Narrative  . No narrative on file    Family History  Problem Relation Age of Onset  . Hypertension Mother   . Rheum arthritis Mother   . Hypertension Father   . Diabetes Mellitus II Father   . Hypertension Sister   . Breast cancer Neg Hx     Physical Exam: Blood pressure 136/84, pulse 75, temperature 97.7 F (36.5 C), temperature source Oral, resp. rate 16, height 5\' 4"  (1.626 m), weight 96.2 kg (212 lb), SpO2 98 %., There is no height or weight on file to calculate BMI. General: Well developed, well nourished AAF. Appears in no acute distress. HEENT: Normocephalic, atraumatic. Conjunctiva pink, sclera non-icteric. Pupils 2 mm constricting to 1 mm, round, regular, and equally reactive to light and accomodation. EOMI. Internal auditory canal clear. TMs with good cone of light and without pathology. Nasal mucosa pink. Nares are without discharge. No sinus tenderness. Oral mucosa pink.  Pharynx  without exudate.   Neck: Supple. Trachea midline. No thyromegaly. Full ROM. No lymphadenopathy.No Carotid Bruits. Lungs: Clear to auscultation bilaterally without wheezes, rales, or  rhonchi. Breathing is of normal effort and unlabored. Cardiovascular: RRR with S1 S2. No murmurs, rubs, or gallops. Distal pulses 2+ symmetrically. No carotid or abdominal bruits. Breast: Symmetrical. No masses. Nipples without discharge. Abdomen: Soft, non-tender, non-distended with normoactive bowel sounds. No hepatosplenomegaly or masses. No rebound/guarding. No CVA tenderness. No hernias.  Genitourinary:  She defers. Has had hysterectomy. Musculoskeletal: Full range of motion and 5/5 strength throughout. Skin: Warm and moist without erythema, ecchymosis, wounds, or rash. Neuro: A+Ox3. CN II-XII grossly intact. Moves all extremities spontaneously. Full sensation throughout. Normal gait. Psych:  Responds to questions appropriately with a normal affect.   Assessment/Plan:  56 y.o. y/o female here for CPE  1. Encounter for preventive health examination  A. Screening Labs: She had screening labs on 04/02/17. All of these were normal.  B. Pap: She has had hysterectomy and this was secondary to fibroids. She had no cancer. Therefore needs no further Pap smear. Discussed doing pelvic exam today but she defers.  C. Screening Mammogram: She had mammogram 10/16/2016--- negative  D. DEXA/BMD:  She is not sure whether she had complete hysterectomy and oophorectomy or just hysterectomy. We'll discuss this further at next visit. Can wait to start doing DEXA.  E. Colorectal Cancer Screening: She reports that she had colonoscopy by Dr. Ewing SchleinMagod at [facility] one year ago and was told can wait 10 years to repeat. Today we are having her sign release form so we can get this to put in health maintenance. Addendum added on 04/26/17: Received colonoscopy report from [facility] Dr. Ewing SchleinMagod --procedure date 07/01/2015 --- Findings:  Internal hemorrhoids.  Diverticulosis.  Exam otherwise normal.  Repeat colonoscopy in 10 years. I have given this to Tiffany to document in health maintenance and send to scan.  F.  Immunizations:  Influenza: She has already had flu vaccine for this season Tetanus: Last tetanus was 2007. This is due to update and she is agreeable to update today. T dap given here until 04/16/17 Pneumococcal: She has no indication to require pneumonia vaccine until age 56  Shingrix: I wrote this down for her on her AVS. Discussed with her to call her insurance and find out with her specific insurance coverage, check this they cover and what her cost would be and then let us know.  Murray HodgkinsSigned, Mary Beth [place], [place], [facility] 04/16/2017 12:09 PM

## 2017-04-16 NOTE — Addendum Note (Signed)
Addended by: Phineas SemenJOHNSON, TIFFANY A on: 04/16/2017 03:13 PM   Modules accepted: Orders

## 2017-05-21 ENCOUNTER — Other Ambulatory Visit: Payer: Self-pay | Admitting: Physician Assistant

## 2017-05-21 DIAGNOSIS — I10 Essential (primary) hypertension: Secondary | ICD-10-CM

## 2017-08-18 ENCOUNTER — Other Ambulatory Visit: Payer: Self-pay | Admitting: Physician Assistant

## 2017-08-18 DIAGNOSIS — I10 Essential (primary) hypertension: Secondary | ICD-10-CM

## 2017-08-20 NOTE — Telephone Encounter (Signed)
Refill appropriate 

## 2017-09-24 ENCOUNTER — Other Ambulatory Visit: Payer: Self-pay | Admitting: Physician Assistant

## 2017-09-24 DIAGNOSIS — Z1231 Encounter for screening mammogram for malignant neoplasm of breast: Secondary | ICD-10-CM

## 2017-10-01 ENCOUNTER — Other Ambulatory Visit: Payer: Self-pay | Admitting: Physician Assistant

## 2017-10-29 ENCOUNTER — Ambulatory Visit
Admission: RE | Admit: 2017-10-29 | Discharge: 2017-10-29 | Disposition: A | Discharge status: Home / Self Care | Payer: 59 | Source: Ambulatory Visit | Attending: Physician Assistant | Admitting: Physician Assistant

## 2017-10-29 DIAGNOSIS — Z1231 Encounter for screening mammogram for malignant neoplasm of breast: Secondary | ICD-10-CM

## 2017-11-16 ENCOUNTER — Other Ambulatory Visit: Payer: Self-pay | Admitting: Physician Assistant

## 2017-11-16 DIAGNOSIS — I10 Essential (primary) hypertension: Secondary | ICD-10-CM

## 2018-01-30 ENCOUNTER — Other Ambulatory Visit: Payer: Self-pay

## 2018-01-30 ENCOUNTER — Encounter: Payer: Self-pay | Admitting: Physician Assistant

## 2018-01-30 ENCOUNTER — Ambulatory Visit (INDEPENDENT_AMBULATORY_CARE_PROVIDER_SITE_OTHER): Payer: 59 | Admitting: Physician Assistant

## 2018-01-30 VITALS — BP 136/92 | HR 76 | Temp 98.1°F | Resp 16 | Ht 64.0 in | Wt 211.6 lb

## 2018-01-30 DIAGNOSIS — I10 Essential (primary) hypertension: Secondary | ICD-10-CM | POA: Diagnosis not present

## 2018-01-30 DIAGNOSIS — H6122 Impacted cerumen, left ear: Secondary | ICD-10-CM | POA: Diagnosis not present

## 2018-01-30 NOTE — Progress Notes (Signed)
Patient ID: Rachel CooperMelinda L Kesselman MRN: [id number], DOB: 10/13/1960, 57 y.o. Date of Encounter: 01/30/2018, 2:32 PM    Chief Complaint:  Chief Complaint  Patient presents with  . left ear discomfort     HPI: 57 y.o. year old AA female here for followup of her hypertension.   "Social History": She has a daughter who is in college in [employer] [employer]. 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 Said that he actually is at home with her right now. Says that he was a communications major. Says that she is trying to get him to realize that he has got to "get it together' At OV 12/2015--- says that he has gotten a job in [place]----says that it is a company that does Product/process development scientistmedical software. Says that he is able to look at apartments online etc. Starts the job in October. Says that he knows to friends who work there as well. Says that she has learned that apparently there is a lot of young people in that area and that he should be happy. Do not know until her conversation today that she is actually from [place] and her family is in [place]. Told her that we are going to visit my brother in [place]. At OV 06/2016---says her daughter has gotten interviews for 2 Vet schools-----hoping she will get in / accepted.  At OV 04/02/2017: her daughter was accepted to a International aid/development workerveterinarian school in [place]. She is in her first semester there.  --------------- she reports that her son is still working with the company that does Product/process development scientistmedical software in [place]. Says that he has done some traveling with his job to [place] in several other locations. Says that he is still happy with his job.        ---She shows me a picture on  her phone of 2 little fluffy dogs--- those were her "mothers day gift"       ----she also states that she has been doing craft projects with a group on a regular basis. Has to set up for 150--!!!  At OV 11/2014 and again at OV 12/2015--she says that she has continued to keep her diet about the same as it has been for the past couple years. Says that with her son at home he is always eating chicken [place] or fish. Frequently makes smoothies with spinach kale and fruit. Patient does not work. 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    At OV 01/30/2018-- She is taking all 3 blood pressure medications as directed. No adverse effects. No lightheadedness. No lower extremity edema. She says that she does check her blood pressure at home and gets good readings  She reports that her left ear feels stopped up.  Thinks that it may have wax blocked up again.  No other specific concerns today.     Home Meds:    Outpatient Medications Prior to Visit  Medication Sig Dispense Refill  . benazepril (LOTENSIN) 10 MG tablet TAKE 1 TABLET BY MOUTH EVERY DAY 90 tablet 0  .  hydrochlorothiazide (HYDRODIURIL) 25 MG tablet TAKE 1 TABLET BY MOUTH EVERY DAY 90 tablet 0  . metoprolol tartrate (LOPRESSOR) 25 MG tablet TAKE 1/2 TABLET TWICE DAILY 180 tablet 0   No facility-administered medications prior to visit.      Allergies: No Known Allergies    Review of Systems: See HPI for pertinent ROS. All other ROS negative.    Physical Exam: Blood pressure (!) 136/92, pulse 76, temperature 98.1 F (36.7 C), temperature source Oral, resp. rate 16, height 5\' 4"  (1.626 m), weight 96 kg, SpO2 99 %., Body mass index is 36.32 kg/m. General:  WNWD AAF. Appears in no acute distress. Head: Right ear canal patent.  Left ear canal obstructed with cerumen. Neck: Supple. No thyromegaly. No lymphadenopathy.  No  carotid bruit. Lungs: Clear bilaterally to auscultation without wheezes, rales, or rhonchi. Breathing is unlabored. Heart: RRR with S1 S2. No murmurs, rubs, or gallops. Musculoskeletal:  Strength and tone normal for age. Extremities/Skin: Warm and dry.  Neuro: Alert and oriented X 3. Moves all extremities spontaneously. Gait is normal. CNII-XII grossly in tact. Psych:  Responds to questions appropriately with a normal affect.     ASSESSMENT AND PLAN:  57 y.o. year old female with  Hypertension 01/30/2018--  reviewed with her that blood pressure borderline high today.  However she reports that she checks blood pressure at home and gets good readings there.  Therefore will continue medications the same and monitor.. Continue current medications. Check labs monitor. - BASIC METABOLIC PANEL WITH GFR - metoprolol tartrate (LOPRESSOR) 25 MG tablet; TAKE 1/2 TABLET BY MOUTH TWICE A DAY  Dispense: 90 tablet; Refill: 1 - hydrochlorothiazide (HYDRODIURIL) 25 MG tablet; Take 1 tablet (25 mg total) by mouth daily.  Dispense: 90 tablet; Refill: 1 - benazepril (LOTENSIN) 10 MG tablet; TAKE 1 TABLET BY MOUTH EVERY DAY  Dispense: 90 tablet; Refill: 1    Preventive Care: --- see CPE note 04/16/2017.   2. Impacted cerumen of left ear Irrigate now.    [address]igned, Mary Beth [place], [place], [facility] 01/30/2018 2:32 PM

## 2018-01-31 LAB — BASIC METABOLIC PANEL WITH GFR
BUN: 17 mg/dL (ref 7–25)
CHLORIDE: 99 mmol/L (ref 98–110)
CO2: 31 mmol/L (ref 20–32)
Calcium: 10 mg/dL (ref 8.6–10.4)
Creat: 0.81 mg/dL (ref 0.50–1.05)
GFR, EST AFRICAN AMERICAN: 93 mL/min/{1.73_m2} (ref >=60)
GFR, EST NON AFRICAN AMERICAN: 80 mL/min/{1.73_m2} (ref >=60)
Glucose, Bld: 100 mg/dL — ABNORMAL HIGH (ref 65–99)
POTASSIUM: 3.4 mmol/L — AB (ref 3.5–5.3)
SODIUM: 141 mmol/L (ref 135–146)

## 2018-02-15 ENCOUNTER — Other Ambulatory Visit: Payer: Self-pay | Admitting: Physician Assistant

## 2018-02-15 DIAGNOSIS — I10 Essential (primary) hypertension: Secondary | ICD-10-CM

## 2018-04-11 ENCOUNTER — Other Ambulatory Visit: Payer: Self-pay | Admitting: Physician Assistant

## 2018-05-13 ENCOUNTER — Other Ambulatory Visit: Payer: Self-pay | Admitting: Family Medicine

## 2018-05-13 DIAGNOSIS — I10 Essential (primary) hypertension: Secondary | ICD-10-CM

## 2018-05-13 MED ORDER — HYDROCHLOROTHIAZIDE 25 MG PO TABS: 25.0000 mg | ORAL_TABLET | Freq: Every day | ORAL | 2 refills | Status: DC

## 2018-05-13 MED ORDER — BENAZEPRIL HCL 10 MG PO TABS: 10.0000 mg | ORAL_TABLET | Freq: Every day | ORAL | 2 refills | Status: DC

## 2018-05-13 NOTE — Addendum Note (Signed)
Addended by: Legrand RamsWILLIS, Juliann Olesky B on: 05/13/2018 10:11 AM   Modules accepted: Orders

## 2018-10-09 ENCOUNTER — Other Ambulatory Visit: Payer: Self-pay | Admitting: Family Medicine

## 2018-10-09 MED ORDER — METOPROLOL TARTRATE 25 MG PO TABS: 12.5000 mg | ORAL_TABLET | Freq: Two times a day (BID) | ORAL | 0 refills | Status: DC

## 2018-12-06 ENCOUNTER — Other Ambulatory Visit: Payer: Self-pay | Admitting: Family Medicine

## 2018-12-06 DIAGNOSIS — Z1231 Encounter for screening mammogram for malignant neoplasm of breast: Secondary | ICD-10-CM

## 2019-01-07 ENCOUNTER — Other Ambulatory Visit: Payer: Self-pay

## 2019-01-07 ENCOUNTER — Ambulatory Visit
Admission: RE | Admit: 2019-01-07 | Discharge: 2019-01-07 | Disposition: A | Discharge status: Home / Self Care | Payer: 59 | Source: Ambulatory Visit | Attending: Family Medicine | Admitting: Family Medicine

## 2019-01-07 DIAGNOSIS — Z1231 Encounter for screening mammogram for malignant neoplasm of breast: Secondary | ICD-10-CM

## 2019-01-17 ENCOUNTER — Other Ambulatory Visit: Payer: Self-pay | Admitting: Family Medicine

## 2019-01-17 MED ORDER — METOPROLOL TARTRATE 25 MG PO TABS: 12.5000 mg | ORAL_TABLET | Freq: Two times a day (BID) | ORAL | 0 refills | Status: DC

## 2019-02-12 ENCOUNTER — Other Ambulatory Visit: Payer: Self-pay | Admitting: Family Medicine

## 2019-02-12 DIAGNOSIS — I10 Essential (primary) hypertension: Secondary | ICD-10-CM

## 2019-03-09 ENCOUNTER — Other Ambulatory Visit: Payer: Self-pay | Admitting: Family Medicine

## 2019-03-09 DIAGNOSIS — I10 Essential (primary) hypertension: Secondary | ICD-10-CM

## 2019-03-10 MED ORDER — BENAZEPRIL HCL 10 MG PO TABS: 10.0000 mg | ORAL_TABLET | Freq: Every day | ORAL | 0 refills | Status: DC

## 2019-03-10 MED ORDER — METOPROLOL TARTRATE 25 MG PO TABS: 12.5000 mg | ORAL_TABLET | Freq: Two times a day (BID) | ORAL | 0 refills | Status: DC

## 2019-03-10 MED ORDER — HYDROCHLOROTHIAZIDE 25 MG PO TABS: 25.0000 mg | ORAL_TABLET | Freq: Every day | ORAL | 0 refills | Status: DC

## 2019-03-12 ENCOUNTER — Ambulatory Visit (INDEPENDENT_AMBULATORY_CARE_PROVIDER_SITE_OTHER): Payer: 59 | Admitting: Family Medicine

## 2019-03-12 ENCOUNTER — Encounter: Payer: Self-pay | Admitting: Family Medicine

## 2019-03-12 ENCOUNTER — Other Ambulatory Visit: Payer: Self-pay | Admitting: Family Medicine

## 2019-03-12 ENCOUNTER — Other Ambulatory Visit: Payer: Self-pay

## 2019-03-12 VITALS — BP 136/74 | HR 78 | Temp 98.6°F | Resp 14 | Ht 64.0 in | Wt 213.0 lb

## 2019-03-12 DIAGNOSIS — I10 Essential (primary) hypertension: Secondary | ICD-10-CM

## 2019-03-12 DIAGNOSIS — Z6836 Body mass index (BMI) 36.0-36.9, adult: Secondary | ICD-10-CM | POA: Diagnosis not present

## 2019-03-12 DIAGNOSIS — Z0001 Encounter for general adult medical examination with abnormal findings: Secondary | ICD-10-CM

## 2019-03-12 DIAGNOSIS — Z0000 Encounter for general adult medical examination without abnormal findings: Secondary | ICD-10-CM

## 2019-03-12 MED ORDER — METOPROLOL TARTRATE 25 MG PO TABS: 12.5000 mg | ORAL_TABLET | Freq: Two times a day (BID) | ORAL | 1 refills | Status: DC

## 2019-03-12 MED ORDER — BENAZEPRIL HCL 10 MG PO TABS: 10.0000 mg | ORAL_TABLET | Freq: Every day | ORAL | 1 refills | Status: DC

## 2019-03-12 MED ORDER — HYDROCHLOROTHIAZIDE 25 MG PO TABS: 25.0000 mg | ORAL_TABLET | Freq: Every day | ORAL | 1 refills | Status: DC

## 2019-03-12 MED ORDER — SHINGRIX 50 MCG/0.5ML IM SUSR: 0.5000 mL | Freq: Once | INTRAMUSCULAR | 1 refills | Status: AC

## 2019-03-12 NOTE — Progress Notes (Signed)
   Subjective:    Patient ID: Rachel Estes, female    DOB: 20-Apr-1961, 58 y.o.   MRN: [id number]  Patient presents for Annual Exam (is fasting) Patient here for annual exam and to follow-up medications.  She is due for fasting labs.  Her medications and history reviewed.  She is status post hysterectomy for noncancerous reason does not need Pap smear.  Mammogram is up-to-date colonoscopy up-to-date.  Immunizations discussed shingles vaccine and flu shot.  Tetanus is up-to-date.   Hypertension she is taking her blood pressure medicines as prescribed without any side effects.  She is trying to walk on a regular basis and watch what she is eating.  [place]:  GEN- denies fatigue, fever, weight loss,weakness, recent illness HEENT- denies eye drainage, change in vision, nasal discharge, CVS- denies chest pain, palpitations RESP- denies SOB, cough, wheeze ABD- denies N/V, change in stools, abd pain GU- denies dysuria, hematuria, dribbling, incontinence MSK- denies joint pain, muscle aches, injury Neuro- denies headache, dizziness, syncope, seizure activity       Objective:    BP 136/74   Pulse 78   Temp 98.6 F (37 C) (Oral)   Resp 14   Ht 5\' 4"  (1.626 m)   Wt 213 lb (96.6 kg)   SpO2 97%   BMI 36.56 kg/m  GEN- NAD, alert and oriented x3 HEENT- PERRL, EOMI, non injected sclera, pink conjunctiva, MMM, oropharynx clear Neck- Supple, no thyromegaly CVS- RRR, no murmur RESP-CTAB ABD-NABS,soft,NT,ND EXT- No edema Pulses- Radial, DP- 2+   Fall/audit C/depression screen negative     Assessment & Plan:      Problem List Items Addressed This Visit      Unprioritized   Hypertension   Relevant Medications   benazepril (LOTENSIN) 10 MG tablet   hydrochlorothiazide (HYDRODIURIL) 25 MG tablet   metoprolol tartrate (LOPRESSOR) 25 MG tablet   Other Relevant Orders   CBC with Differential/Platelet   Comprehensive metabolic panel   Lipid panel    TSH   Obesity    Other Visit Diagnoses    Routine general medical examination at a health care facility    -  Primary   CPE done, BP good control, no change to meds, shingrix to pharmacy, declines flu shot, fasting labs, encourage healthy eating, daily exercise      Note: This dictation was prepared with Dragon dictation along with smaller phrase technology. Any transcriptional errors that result from this process are unintentional.

## 2019-03-12 NOTE — Patient Instructions (Addendum)
F/U 1 year for physical I recommend eye visit once a year I recommend dental visit every 6 months Goal is to  Exercise 30 minutes 5 days a week     

## 2019-03-13 LAB — CBC WITH DIFFERENTIAL/PLATELET
Absolute Monocytes: 548 cells/uL (ref 200–950)
Basophils Absolute: 33 cells/uL (ref 0–200)
Basophils Relative: 0.4 %
Eosinophils Absolute: 50 cells/uL (ref 15–500)
Eosinophils Relative: 0.6 %
HCT: 43.7 % (ref 35.0–45.0)
Hemoglobin: 14.5 g/dL (ref 11.7–15.5)
Lymphs Abs: 2465 cells/uL (ref 850–3900)
MCH: 28.2 pg (ref 27.0–33.0)
MCHC: 33.2 g/dL (ref 32.0–36.0)
MCV: 84.9 fL (ref 80.0–100.0)
MPV: 11.1 fL (ref 7.5–12.5)
Monocytes Relative: 6.6 %
Neutro Abs: 5204 cells/uL (ref 1500–7800)
Neutrophils Relative %: 62.7 %
Platelets: 268 10*3/uL (ref 140–400)
RBC: 5.15 10*6/uL — ABNORMAL HIGH (ref 3.80–5.10)
RDW: 12.7 % (ref 11.0–15.0)
Total Lymphocyte: 29.7 %
WBC: 8.3 10*3/uL (ref 3.8–10.8)

## 2019-03-13 LAB — COMPREHENSIVE METABOLIC PANEL
AG Ratio: 1.9 (calc) (ref 1.0–2.5)
ALT: 19 U/L (ref 6–29)
AST: 17 U/L (ref 10–35)
Albumin: 4.5 g/dL (ref 3.6–5.1)
Alkaline phosphatase (APISO): 88 U/L (ref 37–153)
BUN: 16 mg/dL (ref 7–25)
CO2: 30 mmol/L (ref 20–32)
Calcium: 10.1 mg/dL (ref 8.6–10.4)
Chloride: 100 mmol/L (ref 98–110)
Creat: 0.95 mg/dL (ref 0.50–1.05)
Globulin: 2.4 g/dL (calc) (ref 1.9–3.7)
Glucose, Bld: 100 mg/dL — ABNORMAL HIGH (ref 65–99)
Potassium: 3.8 mmol/L (ref 3.5–5.3)
Sodium: 141 mmol/L (ref 135–146)
Total Bilirubin: 0.5 mg/dL (ref 0.2–1.2)
Total Protein: 6.9 g/dL (ref 6.1–8.1)

## 2019-03-13 LAB — LIPID PANEL
Cholesterol: 197 mg/dL (ref <200)
HDL: 41 mg/dL — ABNORMAL LOW (ref >=50)
LDL Cholesterol (Calc): 131 mg/dL (calc) — ABNORMAL HIGH
Non-HDL Cholesterol (Calc): 156 mg/dL (calc) — ABNORMAL HIGH (ref <130)
Total CHOL/HDL Ratio: 4.8 (calc) (ref <5.0)
Triglycerides: 133 mg/dL (ref <150)

## 2019-03-13 LAB — TSH: TSH: 1.45 mIU/L (ref 0.40–4.50)

## 2019-03-14 ENCOUNTER — Encounter: Payer: Self-pay | Admitting: *Deleted

## 2019-09-01 DIAGNOSIS — Z20828 Contact with and (suspected) exposure to other viral communicable diseases: Secondary | ICD-10-CM | POA: Diagnosis not present

## 2019-09-11 ENCOUNTER — Other Ambulatory Visit: Payer: Self-pay | Admitting: Family Medicine

## 2019-09-11 DIAGNOSIS — I10 Essential (primary) hypertension: Secondary | ICD-10-CM

## 2019-10-12 ENCOUNTER — Other Ambulatory Visit: Payer: Self-pay | Admitting: Family Medicine

## 2019-12-19 ENCOUNTER — Other Ambulatory Visit: Payer: Self-pay | Admitting: Family Medicine

## 2019-12-19 DIAGNOSIS — Z1231 Encounter for screening mammogram for malignant neoplasm of breast: Secondary | ICD-10-CM

## 2020-01-14 ENCOUNTER — Other Ambulatory Visit: Payer: Self-pay

## 2020-01-14 ENCOUNTER — Ambulatory Visit
Admission: RE | Admit: 2020-01-14 | Discharge: 2020-01-14 | Disposition: A | Discharge status: Home / Self Care | Payer: BC Managed Care – PPO | Source: Ambulatory Visit | Attending: Family Medicine | Admitting: Family Medicine

## 2020-01-14 DIAGNOSIS — Z1231 Encounter for screening mammogram for malignant neoplasm of breast: Secondary | ICD-10-CM | POA: Diagnosis not present

## 2020-03-11 ENCOUNTER — Other Ambulatory Visit: Payer: Self-pay | Admitting: Family Medicine

## 2020-03-11 DIAGNOSIS — I10 Essential (primary) hypertension: Secondary | ICD-10-CM

## 2020-03-12 ENCOUNTER — Encounter: Payer: 59 | Admitting: Family Medicine

## 2020-04-07 ENCOUNTER — Ambulatory Visit (INDEPENDENT_AMBULATORY_CARE_PROVIDER_SITE_OTHER): Payer: Self-pay | Admitting: Family Medicine

## 2020-04-07 ENCOUNTER — Encounter: Payer: Self-pay | Admitting: Family Medicine

## 2020-04-07 ENCOUNTER — Other Ambulatory Visit: Payer: Self-pay

## 2020-04-07 VITALS — BP 146/90 | HR 79 | Temp 98.4°F | Ht 64.0 in | Wt 217.8 lb

## 2020-04-07 DIAGNOSIS — Z23 Encounter for immunization: Secondary | ICD-10-CM

## 2020-04-07 DIAGNOSIS — I10 Essential (primary) hypertension: Secondary | ICD-10-CM

## 2020-04-07 DIAGNOSIS — Z0000 Encounter for general adult medical examination without abnormal findings: Secondary | ICD-10-CM

## 2020-04-07 DIAGNOSIS — Z6837 Body mass index (BMI) 37.0-37.9, adult: Secondary | ICD-10-CM

## 2020-04-07 NOTE — Patient Instructions (Addendum)
Vitamin D 1000Iu  Increase metoprolol to 1 tablet twice a day  Call  In 2 weeks with blood pressure readings Flu shot in given  Stop the benezapril /Lotensin  F/U 1 year for Physical

## 2020-04-07 NOTE — Assessment & Plan Note (Signed)
intermittent hives and swelling in the face I would recommend her discontinuing the ACE inhibitor to see if this is contributing.  He has never had any food allergies.  In the meantime we will increase her metoprolol to 25 mg twice a day continue the HCTZ.  If she still gets allergic reactions even off the ACEI, I recommend allergy testing

## 2020-04-07 NOTE — Progress Notes (Signed)
   Subjective:    Patient ID: Rachel Estes, female    DOB: 01/21/1961, 59 y.o.   MRN: [id number]  Patient presents for Annual Exam  Pt here for CPE, meds reviewed   HTN- taking lotensin, HCTZ and metoprolol as prescribed   Mammogram UTD   Colonoscopy UTD  IMMunizations-  Due for flu shot   SHe has had a couple episodes of swelling of her face as well as some mild hives.  She does recall eating cantaloupe and strawberries one of the times but she has never had a reaction to this before.  She never had any difficulty breathing.  She did not take anything the swelling went down on its own.   COVID 19- [facility] [address]  Tries to stay active by walking her dogs.  She does get occ chronic right ankle pain but is very mild  Review Of Systems:  GEN- denies fatigue, fever, weight loss,weakness, recent illness HEENT- denies eye drainage, change in vision, nasal discharge, CVS- denies chest pain, palpitations RESP- denies SOB, cough, wheeze ABD- denies N/V, change in stools, abd pain GU- denies dysuria, hematuria, dribbling, incontinence MSK- denies joint pain, muscle aches, injury Neuro- denies headache, dizziness, syncope, seizure activity       Objective:    BP (!) 146/90 (BP Location: Right Arm, Patient Position: Sitting, Cuff Size: Large)   Pulse 79   Temp 98.4 F (36.9 C) (Oral)   Ht 5\' 4"  (1.626 m)   Wt 217 lb 12.8 oz (98.8 kg)   SpO2 97%   BMI 37.39 kg/m  GEN- NAD, alert and oriented x3 HEENT- PERRL, EOMI, non injected sclera, pink conjunctiva, MMM, oropharynx clear Neck- Supple, no thyromegaly CVS- RRR, no murmur RESP-CTAB ABD-NABS,soft,NT,ND EXT- No edema Pulses- Radial, DP- 2+    FALL/AUDITC/DEPRESSION  Screen negative     Assessment & Plan:      Problem List Items Addressed This Visit      Unprioritized   Hypertension     intermittent hives and swelling in the face I would recommend her discontinuing the ACE inhibitor to see if this is  contributing.  He has never had any food allergies.  In the meantime we will increase her metoprolol to 25 mg twice a day continue the HCTZ.  If she still gets allergic reactions even off the ACEI, I recommend allergy testing       Relevant Orders   Lipid panel   Obesity   Relevant Orders   Hemoglobin A1c    Other Visit Diagnoses    Routine general medical examination at a health care facility    -  Primary   CPE done, fasting labs obtained, Flu shot given   Relevant Orders   CBC with Differential/Platelet   Comprehensive metabolic panel   Need for immunization against influenza       Relevant Orders   Flu Vaccine QUAD 36+ mos IM (Completed)      Note: This dictation was prepared with Dragon dictation along with smaller phrase technology. Any transcriptional errors that result from this process are unintentional.

## 2020-04-08 LAB — CBC WITH DIFFERENTIAL/PLATELET
Absolute Monocytes: 748 cells/uL (ref 200–950)
Basophils Absolute: 58 cells/uL (ref 0–200)
Basophils Relative: 0.5 %
Eosinophils Absolute: 69 cells/uL (ref 15–500)
Eosinophils Relative: 0.6 %
HCT: 45.4 % — ABNORMAL HIGH (ref 35.0–45.0)
Hemoglobin: 14.9 g/dL (ref 11.7–15.5)
Lymphs Abs: 3255 cells/uL (ref 850–3900)
MCH: 28.4 pg (ref 27.0–33.0)
MCHC: 32.8 g/dL (ref 32.0–36.0)
MCV: 86.5 fL (ref 80.0–100.0)
MPV: 11.2 fL (ref 7.5–12.5)
Monocytes Relative: 6.5 %
Neutro Abs: 7372 cells/uL (ref 1500–7800)
Neutrophils Relative %: 64.1 %
Platelets: 307 10*3/uL (ref 140–400)
RBC: 5.25 10*6/uL — ABNORMAL HIGH (ref 3.80–5.10)
RDW: 12.9 % (ref 11.0–15.0)
Total Lymphocyte: 28.3 %
WBC: 11.5 10*3/uL — ABNORMAL HIGH (ref 3.8–10.8)

## 2020-04-08 LAB — COMPREHENSIVE METABOLIC PANEL
AG Ratio: 1.6 (calc) (ref 1.0–2.5)
ALT: 22 U/L (ref 6–29)
AST: 20 U/L (ref 10–35)
Albumin: 4.6 g/dL (ref 3.6–5.1)
Alkaline phosphatase (APISO): 84 U/L (ref 37–153)
BUN: 15 mg/dL (ref 7–25)
CO2: 33 mmol/L — ABNORMAL HIGH (ref 20–32)
Calcium: 10.2 mg/dL (ref 8.6–10.4)
Chloride: 97 mmol/L — ABNORMAL LOW (ref 98–110)
Creat: 0.87 mg/dL (ref 0.50–0.99)
Globulin: 2.8 g/dL (calc) (ref 1.9–3.7)
Glucose, Bld: 81 mg/dL (ref 65–99)
Potassium: 3.7 mmol/L (ref 3.5–5.3)
Sodium: 141 mmol/L (ref 135–146)
Total Bilirubin: 0.5 mg/dL (ref 0.2–1.2)
Total Protein: 7.4 g/dL (ref 6.1–8.1)

## 2020-04-08 LAB — HEMOGLOBIN A1C
Hgb A1c MFr Bld: 6 % of total Hgb — ABNORMAL HIGH (ref <5.7)
Mean Plasma Glucose: 126 (calc)
eAG (mmol/L): 7 (calc)

## 2020-04-08 LAB — LIPID PANEL
Cholesterol: 214 mg/dL — ABNORMAL HIGH (ref <200)
HDL: 46 mg/dL — ABNORMAL LOW (ref >=50)
LDL Cholesterol (Calc): 137 mg/dL (calc) — ABNORMAL HIGH
Non-HDL Cholesterol (Calc): 168 mg/dL (calc) — ABNORMAL HIGH (ref <130)
Total CHOL/HDL Ratio: 4.7 (calc) (ref <5.0)
Triglycerides: 176 mg/dL — ABNORMAL HIGH (ref <150)

## 2020-04-12 ENCOUNTER — Other Ambulatory Visit: Payer: Self-pay | Admitting: Family Medicine

## 2020-04-28 ENCOUNTER — Telehealth: Payer: Self-pay | Admitting: *Deleted

## 2020-04-28 MED ORDER — METOPROLOL TARTRATE 25 MG PO TABS: 25.0000 mg | ORAL_TABLET | Freq: Two times a day (BID) | ORAL | 1 refills | Status: DC

## 2020-04-28 NOTE — Telephone Encounter (Signed)
Call placed to patient and patient made aware.   Prescription sent to pharmacy.  

## 2020-04-28 NOTE — Telephone Encounter (Signed)
Received call from patient.   Reports that BP readings are as follows: 21-Oct 133/84 1-Nov 140/91  22-Oct 122/88 2-Nov 127/81  23-Oct 125/79 3-Nov 133/82  24-Oct 138/86 4-Nov 130/79  25-Oct 146/86 5-Nov 130/81  26-Oct  6-Nov 136/85  27-Oct  7-Nov 134/79  28-Oct 141/86 8-Nov 132/85  29-Oct 120/80 9-Nov 123/70  30-Oct  10-Nov 137/85   Reports that she is taking medications as directed: HCTZ 25mg  PO QAM Metoprolol 25mg  PO BID.  MD please advise.

## 2020-04-28 NOTE — Telephone Encounter (Signed)
Blood pressure are improving No change to meds, okay to send refills if needed

## 2020-06-06 ENCOUNTER — Other Ambulatory Visit: Payer: Self-pay | Admitting: Family Medicine

## 2020-06-06 DIAGNOSIS — I10 Essential (primary) hypertension: Secondary | ICD-10-CM

## 2020-06-07 NOTE — Telephone Encounter (Signed)
Called pt, lm to call back to ask od rx request. Note says doctor dis'c benazepril.

## 2020-06-16 ENCOUNTER — Encounter: Payer: Self-pay | Admitting: Family Medicine

## 2020-08-29 ENCOUNTER — Other Ambulatory Visit: Payer: Self-pay | Admitting: Family Medicine

## 2020-08-29 DIAGNOSIS — I10 Essential (primary) hypertension: Secondary | ICD-10-CM

## 2020-09-27 ENCOUNTER — Telehealth: Payer: Self-pay | Admitting: Family Medicine

## 2020-09-27 NOTE — Telephone Encounter (Signed)
Agree with disposition. 

## 2020-09-27 NOTE — Telephone Encounter (Signed)
Call placed to patient.   States that she accidentally took (2) doses of both HCTZ and Toprol.   States that she has been monitoring her BP, but it remains WNL.   Advised to monitor for Sx of hypotension: Dizziness or lightheadedness Nausea Syncope Lack of concentration Blurred vision Cold, clammy, pale skin  Advised if Sx noted, go directly to UC.   Patient also states that spouse is at home with her today and will assist in monitoring.

## 2020-09-27 NOTE — Telephone Encounter (Signed)
Patient called to report accidentally taking a double dose of the following medications:.  hydrochlorothiazide (HYDRODIURIL) 25 MG tablet [[id number]]  metoprolol tartrate (LOPRESSOR) 25 MG tablet [[id number]]   Patient tried to make herself throw up to avoid adverse affects but was unsuccessful. Please advise of any possible reactions or side effects at [phone].

## 2020-10-20 ENCOUNTER — Other Ambulatory Visit: Payer: Self-pay | Admitting: Family Medicine

## 2021-01-04 ENCOUNTER — Other Ambulatory Visit: Payer: Self-pay | Admitting: Family Medicine

## 2021-01-04 DIAGNOSIS — I10 Essential (primary) hypertension: Secondary | ICD-10-CM

## 2021-01-19 ENCOUNTER — Other Ambulatory Visit: Payer: Self-pay | Admitting: Family Medicine

## 2021-01-19 DIAGNOSIS — I10 Essential (primary) hypertension: Secondary | ICD-10-CM

## 2021-01-28 DIAGNOSIS — R0683 Snoring: Secondary | ICD-10-CM | POA: Diagnosis not present

## 2021-01-28 DIAGNOSIS — I10 Essential (primary) hypertension: Secondary | ICD-10-CM | POA: Diagnosis not present

## 2021-01-28 DIAGNOSIS — Z833 Family history of diabetes mellitus: Secondary | ICD-10-CM | POA: Diagnosis not present

## 2021-01-28 DIAGNOSIS — E669 Obesity, unspecified: Secondary | ICD-10-CM | POA: Diagnosis not present

## 2021-02-17 DIAGNOSIS — I10 Essential (primary) hypertension: Secondary | ICD-10-CM | POA: Diagnosis not present

## 2021-02-17 DIAGNOSIS — R0683 Snoring: Secondary | ICD-10-CM | POA: Diagnosis not present

## 2021-02-17 DIAGNOSIS — Z833 Family history of diabetes mellitus: Secondary | ICD-10-CM | POA: Diagnosis not present

## 2021-02-17 DIAGNOSIS — R5383 Other fatigue: Secondary | ICD-10-CM | POA: Diagnosis not present

## 2021-03-11 ENCOUNTER — Other Ambulatory Visit: Payer: Self-pay | Admitting: Family Medicine

## 2021-03-11 DIAGNOSIS — Z1231 Encounter for screening mammogram for malignant neoplasm of breast: Secondary | ICD-10-CM

## 2021-03-11 DIAGNOSIS — E782 Mixed hyperlipidemia: Secondary | ICD-10-CM | POA: Diagnosis not present

## 2021-03-11 DIAGNOSIS — E669 Obesity, unspecified: Secondary | ICD-10-CM | POA: Diagnosis not present

## 2021-03-11 DIAGNOSIS — I10 Essential (primary) hypertension: Secondary | ICD-10-CM | POA: Diagnosis not present

## 2021-03-22 ENCOUNTER — Other Ambulatory Visit: Payer: Self-pay | Admitting: Family Medicine

## 2021-03-22 DIAGNOSIS — I10 Essential (primary) hypertension: Secondary | ICD-10-CM

## 2021-04-12 ENCOUNTER — Encounter: Payer: Self-pay | Admitting: Family Medicine

## 2021-04-15 ENCOUNTER — Ambulatory Visit: Payer: BC Managed Care – PPO

## 2021-04-22 DIAGNOSIS — E7800 Pure hypercholesterolemia, unspecified: Secondary | ICD-10-CM | POA: Diagnosis not present

## 2021-04-22 DIAGNOSIS — I10 Essential (primary) hypertension: Secondary | ICD-10-CM | POA: Diagnosis not present

## 2021-04-22 DIAGNOSIS — E6609 Other obesity due to excess calories: Secondary | ICD-10-CM | POA: Diagnosis not present

## 2021-04-22 DIAGNOSIS — E1169 Type 2 diabetes mellitus with other specified complication: Secondary | ICD-10-CM | POA: Diagnosis not present

## 2021-04-22 DIAGNOSIS — E781 Pure hyperglyceridemia: Secondary | ICD-10-CM | POA: Diagnosis not present

## 2021-04-22 DIAGNOSIS — Z23 Encounter for immunization: Secondary | ICD-10-CM | POA: Diagnosis not present

## 2021-04-29 DIAGNOSIS — E6609 Other obesity due to excess calories: Secondary | ICD-10-CM | POA: Diagnosis not present

## 2021-04-29 DIAGNOSIS — I10 Essential (primary) hypertension: Secondary | ICD-10-CM | POA: Diagnosis not present

## 2021-05-18 ENCOUNTER — Ambulatory Visit
Admission: RE | Admit: 2021-05-18 | Discharge: 2021-05-18 | Disposition: A | Discharge status: Home / Self Care | Payer: BC Managed Care – PPO | Source: Ambulatory Visit | Attending: Family Medicine | Admitting: Family Medicine

## 2021-05-18 DIAGNOSIS — Z1231 Encounter for screening mammogram for malignant neoplasm of breast: Secondary | ICD-10-CM | POA: Diagnosis not present

## 2022-04-14 ENCOUNTER — Other Ambulatory Visit: Payer: Self-pay | Admitting: Family Medicine

## 2022-04-14 DIAGNOSIS — Z1231 Encounter for screening mammogram for malignant neoplasm of breast: Secondary | ICD-10-CM

## 2022-06-07 ENCOUNTER — Ambulatory Visit
Admission: RE | Admit: 2022-06-07 | Discharge: 2022-06-07 | Disposition: A | Discharge status: Home / Self Care | Payer: BC Managed Care – PPO | Source: Ambulatory Visit | Attending: Family Medicine | Admitting: Family Medicine

## 2022-06-07 DIAGNOSIS — Z1231 Encounter for screening mammogram for malignant neoplasm of breast: Secondary | ICD-10-CM

## 2023-05-29 ENCOUNTER — Other Ambulatory Visit: Payer: Self-pay | Admitting: Family Medicine

## 2023-05-29 DIAGNOSIS — Z1231 Encounter for screening mammogram for malignant neoplasm of breast: Secondary | ICD-10-CM

## 2023-06-28 ENCOUNTER — Ambulatory Visit: Payer: BC Managed Care – PPO

## 2023-06-28 ENCOUNTER — Ambulatory Visit
Admission: RE | Admit: 2023-06-28 | Discharge: 2023-06-28 | Disposition: A | Discharge status: Home / Self Care | Payer: BC Managed Care – PPO | Source: Ambulatory Visit | Attending: Family Medicine | Admitting: Family Medicine

## 2023-06-28 DIAGNOSIS — Z1231 Encounter for screening mammogram for malignant neoplasm of breast: Secondary | ICD-10-CM

## 2023-09-13 IMAGING — MG MM DIGITAL SCREENING BILAT W/ TOMO AND CAD
8 series · 8 of 24 positions shown · non-contrast
Comparison: Previous exam(s).

CLINICAL DATA: Screening.

EXAM:
DIGITAL SCREENING BILATERAL MAMMOGRAM WITH TOMOSYNTHESIS AND CAD
TECHNIQUE: Bilateral screening digital craniocaudal and mediolateral oblique
mammograms were obtained. Bilateral screening digital breast
tomosynthesis was performed. The images were evaluated with
computer-aided detection.

[R MLO synth-2D]
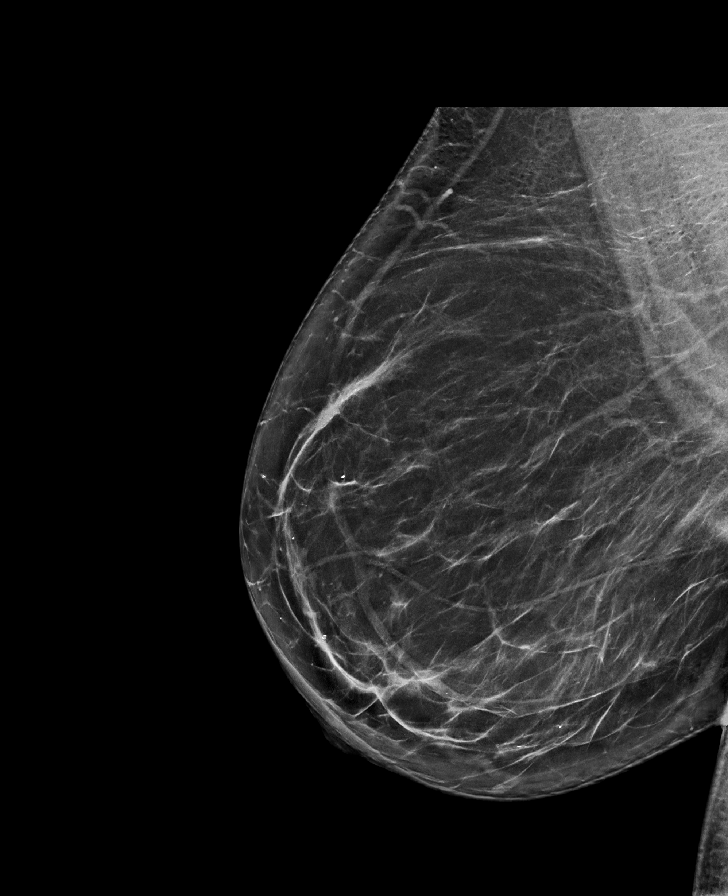

[L CC synth-2D]
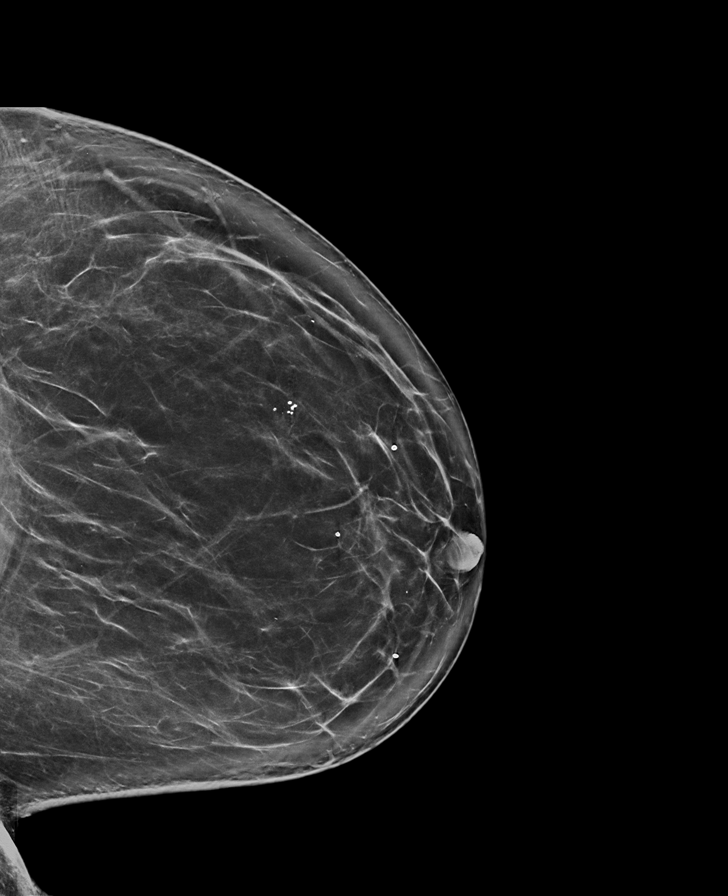

[L MLO synth-2D]
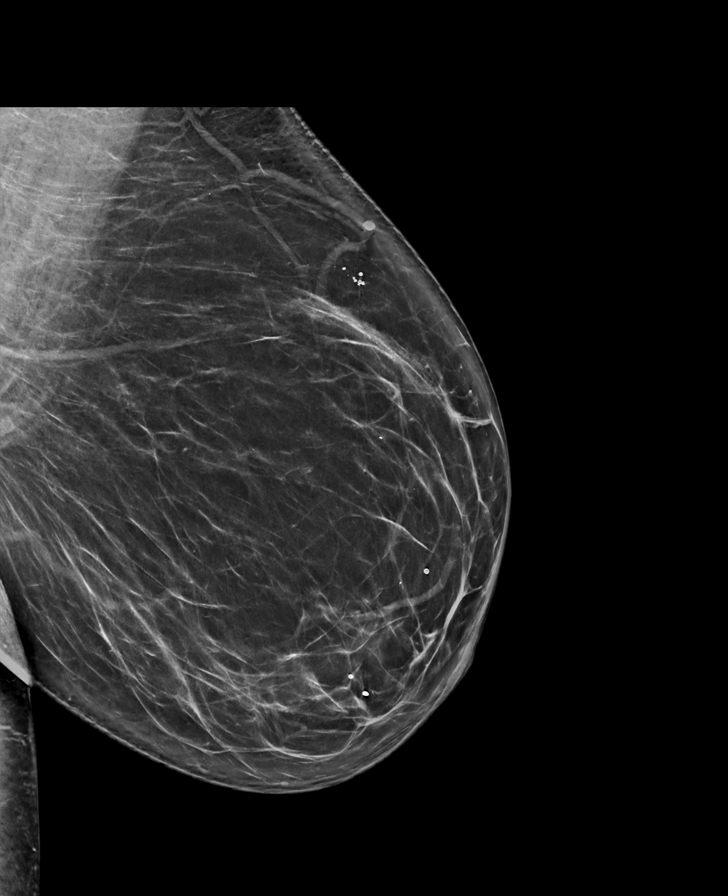

[R CC synth-2D]
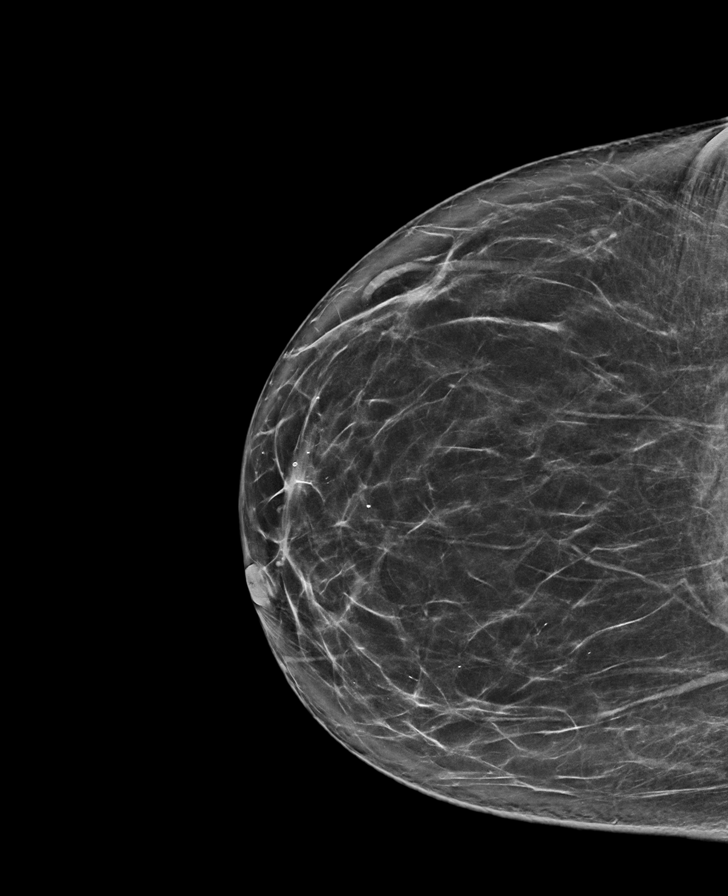

[L CC tomo · tomo slice 47/92.0]
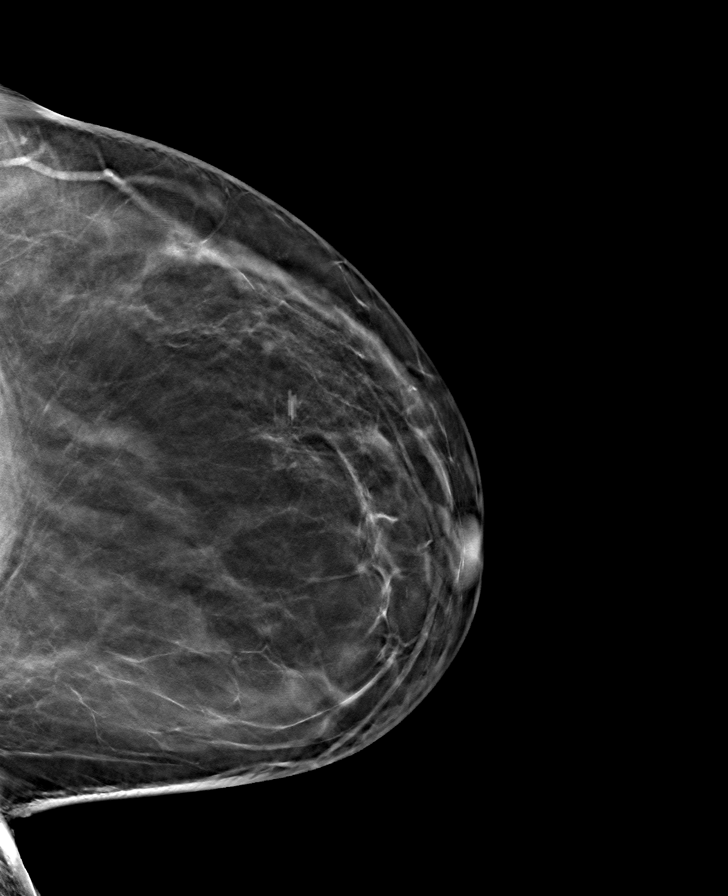

[L MLO tomo · tomo slice 46/91.0]
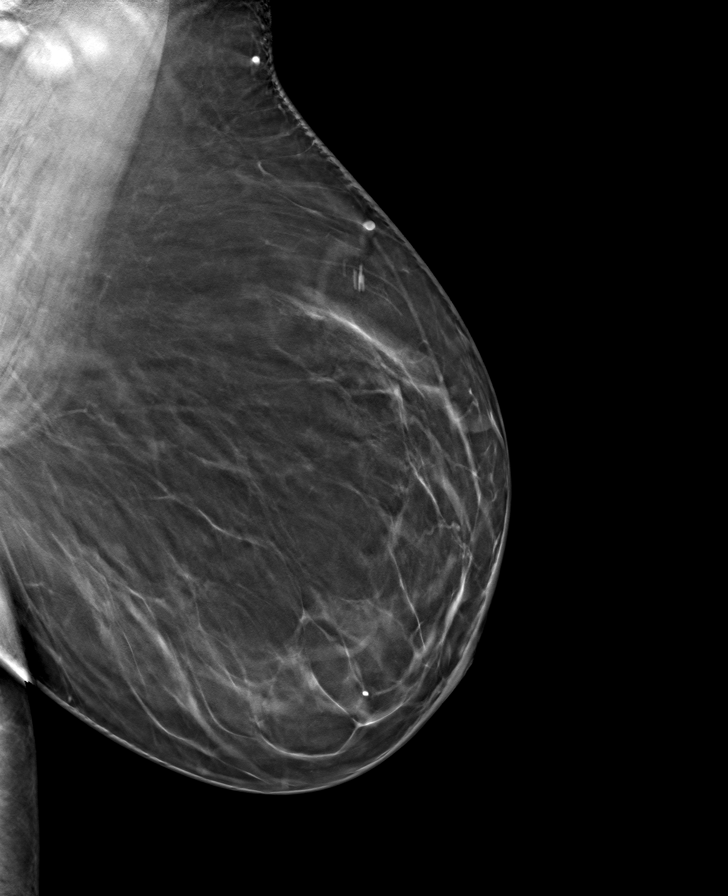

[R CC tomo · tomo slice 42/83.0]
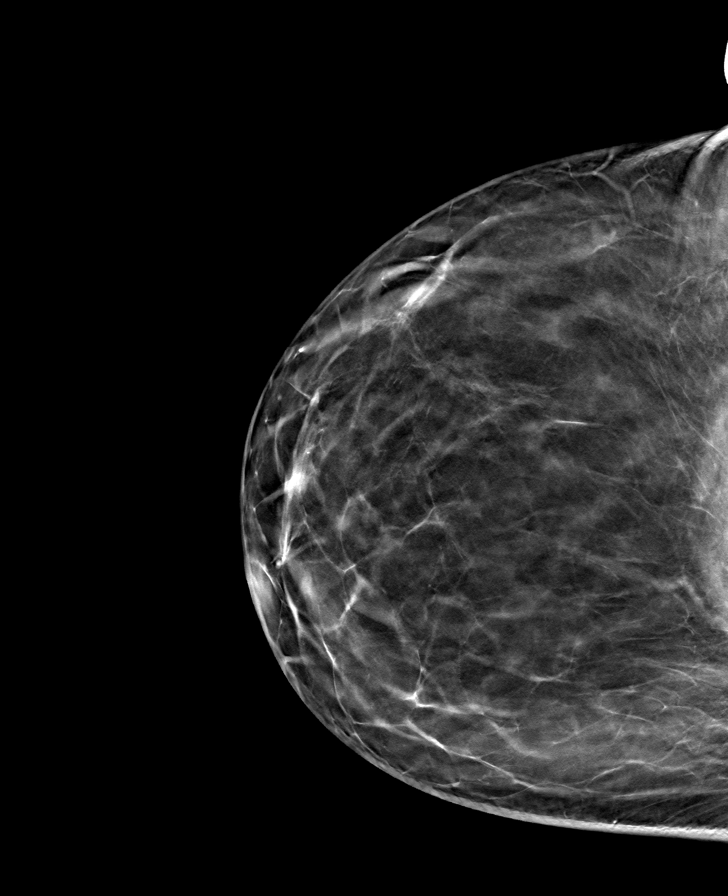

[R MLO tomo · tomo slice 45/90.0]
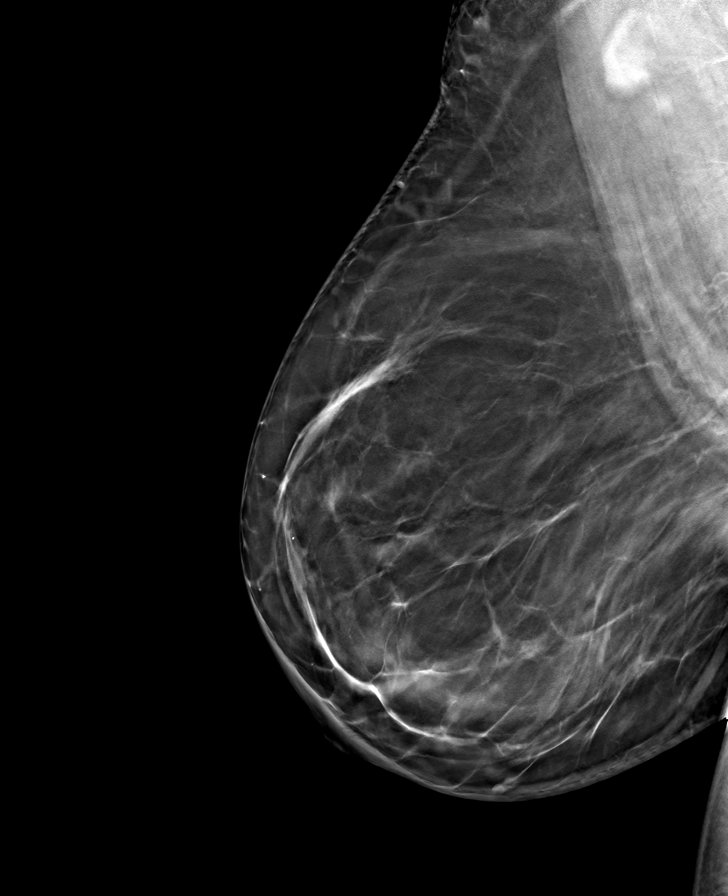

[8 of 24 positions shown; findings below may reference images not displayed]

ACR Breast Density Category b: There are scattered areas of
fibroglandular density.
FINDINGS: There are no findings suspicious for malignancy.
IMPRESSION: No mammographic evidence of malignancy. A result letter of this
screening mammogram will be mailed directly to the patient.

RECOMMENDATION:
Screening mammogram in one year. (Code:51-O-LD2)

BI-RADS CATEGORY  1: Negative.

## 2024-07-23 ENCOUNTER — Other Ambulatory Visit: Payer: Self-pay | Admitting: Family Medicine

## 2024-07-23 DIAGNOSIS — Z1231 Encounter for screening mammogram for malignant neoplasm of breast: Secondary | ICD-10-CM

## 2024-08-08 ENCOUNTER — Ambulatory Visit
